# Patient Record
Sex: Male | Born: 1940 | Race: Black or African American | Hispanic: No | Marital: Single | Smoking: Former smoker
Health system: Southern US, Community
[De-identification: ages and names within clinical notes are randomized; demographics above are authoritative.]

## PROBLEM LIST (undated history)

## (undated) DIAGNOSIS — C61 Malignant neoplasm of prostate: Secondary | ICD-10-CM

## (undated) DIAGNOSIS — C419 Malignant neoplasm of bone and articular cartilage, unspecified: Secondary | ICD-10-CM

## (undated) DIAGNOSIS — E785 Hyperlipidemia, unspecified: Secondary | ICD-10-CM

## (undated) HISTORY — DX: Malignant neoplasm of bone and articular cartilage, unspecified: C41.9

## (undated) HISTORY — DX: Malignant neoplasm of prostate: C61

## (undated) HISTORY — PX: ANKLE SURGERY: SHX546

## (undated) HISTORY — DX: Hyperlipidemia, unspecified: E78.5

---

## 2006-10-11 ENCOUNTER — Emergency Department: Payer: Self-pay

## 2006-11-11 ENCOUNTER — Ambulatory Visit: Payer: Self-pay

## 2014-05-14 DIAGNOSIS — D649 Anemia, unspecified: Secondary | ICD-10-CM | POA: Insufficient documentation

## 2014-06-12 ENCOUNTER — Ambulatory Visit: Payer: Self-pay | Admitting: Family Medicine

## 2014-06-19 ENCOUNTER — Ambulatory Visit: Payer: Self-pay | Admitting: Oncology

## 2014-06-19 LAB — CBC CANCER CENTER
BASOS PCT: 0.5 %
Basophil #: 0 x10 3/mm (ref 0.0–0.1)
EOS PCT: 0.8 %
Eosinophil #: 0.1 x10 3/mm (ref 0.0–0.7)
HCT: 35.6 % — ABNORMAL LOW (ref 40.0–52.0)
HGB: 11.6 g/dL — ABNORMAL LOW (ref 13.0–18.0)
LYMPHS ABS: 2 x10 3/mm (ref 1.0–3.6)
Lymphocyte %: 23 %
MCH: 27.4 pg (ref 26.0–34.0)
MCHC: 32.7 g/dL (ref 32.0–36.0)
MCV: 84 fL (ref 80–100)
Monocyte #: 0.6 x10 3/mm (ref 0.2–1.0)
Monocyte %: 7.5 %
NEUTROS PCT: 68.2 %
Neutrophil #: 5.9 x10 3/mm (ref 1.4–6.5)
Platelet: 344 x10 3/mm (ref 150–440)
RBC: 4.24 10*6/uL — ABNORMAL LOW (ref 4.40–5.90)
RDW: 16 % — AB (ref 11.5–14.5)
WBC: 8.6 x10 3/mm (ref 3.8–10.6)

## 2014-06-19 LAB — COMPREHENSIVE METABOLIC PANEL
Albumin: 2.7 g/dL — ABNORMAL LOW (ref 3.4–5.0)
Alkaline Phosphatase: 595 U/L — ABNORMAL HIGH
Anion Gap: 7 (ref 7–16)
BUN: 17 mg/dL (ref 7–18)
Bilirubin,Total: 0.7 mg/dL (ref 0.2–1.0)
CO2: 31 mmol/L (ref 21–32)
Calcium, Total: 8.9 mg/dL (ref 8.5–10.1)
Chloride: 102 mmol/L (ref 98–107)
Creatinine: 1.67 mg/dL — ABNORMAL HIGH (ref 0.60–1.30)
EGFR (African American): 46 — ABNORMAL LOW
EGFR (Non-African Amer.): 40 — ABNORMAL LOW
Glucose: 105 mg/dL — ABNORMAL HIGH (ref 65–99)
Osmolality: 281 (ref 275–301)
POTASSIUM: 3.5 mmol/L (ref 3.5–5.1)
SGOT(AST): 91 U/L — ABNORMAL HIGH (ref 15–37)
SGPT (ALT): 65 U/L — ABNORMAL HIGH
SODIUM: 140 mmol/L (ref 136–145)
Total Protein: 7.6 g/dL (ref 6.4–8.2)

## 2014-06-19 LAB — PROTIME-INR
INR: 1
PROTHROMBIN TIME: 12.7 s (ref 11.5–14.7)

## 2014-06-19 LAB — APTT: Activated PTT: 35.2 secs (ref 23.6–35.9)

## 2014-06-21 LAB — CEA: CEA: 1 ng/mL (ref 0.0–4.7)

## 2014-06-21 LAB — PSA: PSA: 950.5 ng/mL — AB (ref 0.0–4.0)

## 2014-06-22 ENCOUNTER — Ambulatory Visit: Payer: Self-pay | Admitting: Oncology

## 2014-06-27 LAB — PATHOLOGY REPORT

## 2014-07-10 ENCOUNTER — Ambulatory Visit: Payer: Self-pay | Admitting: Oncology

## 2014-08-09 ENCOUNTER — Ambulatory Visit: Payer: Self-pay | Admitting: Oncology

## 2014-08-21 LAB — COMPREHENSIVE METABOLIC PANEL
ALT: 26 U/L
Albumin: 3.1 g/dL — ABNORMAL LOW (ref 3.4–5.0)
Alkaline Phosphatase: 374 U/L — ABNORMAL HIGH
Anion Gap: 4 — ABNORMAL LOW (ref 7–16)
BILIRUBIN TOTAL: 0.3 mg/dL (ref 0.2–1.0)
BUN: 15 mg/dL (ref 7–18)
CHLORIDE: 101 mmol/L (ref 98–107)
Calcium, Total: 8.9 mg/dL (ref 8.5–10.1)
Co2: 30 mmol/L (ref 21–32)
Creatinine: 1.2 mg/dL (ref 0.60–1.30)
EGFR (Non-African Amer.): >60
Glucose: 98 mg/dL (ref 65–99)
Osmolality: 271 (ref 275–301)
POTASSIUM: 5 mmol/L (ref 3.5–5.1)
SGOT(AST): 25 U/L (ref 15–37)
Sodium: 135 mmol/L — ABNORMAL LOW (ref 136–145)
Total Protein: 7.1 g/dL (ref 6.4–8.2)

## 2014-08-21 LAB — CBC CANCER CENTER
BASOS ABS: 0.1 x10 3/mm (ref 0.0–0.1)
BASOS PCT: 0.9 %
EOS PCT: 1.1 %
Eosinophil #: 0.1 x10 3/mm (ref 0.0–0.7)
HCT: 31.8 % — ABNORMAL LOW (ref 40.0–52.0)
HGB: 10.2 g/dL — AB (ref 13.0–18.0)
LYMPHS ABS: 2.7 x10 3/mm (ref 1.0–3.6)
Lymphocyte %: 37.4 %
MCH: 27.5 pg (ref 26.0–34.0)
MCHC: 32 g/dL (ref 32.0–36.0)
MCV: 86 fL (ref 80–100)
MONO ABS: 0.8 x10 3/mm (ref 0.2–1.0)
Monocyte %: 10.8 %
Neutrophil #: 3.6 x10 3/mm (ref 1.4–6.5)
Neutrophil %: 49.8 %
PLATELETS: 299 x10 3/mm (ref 150–440)
RBC: 3.7 10*6/uL — ABNORMAL LOW (ref 4.40–5.90)
RDW: 18.2 % — ABNORMAL HIGH (ref 11.5–14.5)
WBC: 7.2 x10 3/mm (ref 3.8–10.6)

## 2014-08-23 LAB — PSA: PSA: 79.5 ng/mL — ABNORMAL HIGH (ref 0.0–4.0)

## 2014-09-09 ENCOUNTER — Ambulatory Visit: Payer: Self-pay | Admitting: Oncology

## 2014-09-18 LAB — CBC CANCER CENTER
Basophil #: 0 x10 3/mm (ref 0.0–0.1)
Basophil %: 0.6 %
EOS ABS: 0 x10 3/mm (ref 0.0–0.7)
EOS PCT: 0.6 %
HCT: 32.5 % — AB (ref 40.0–52.0)
HGB: 10.5 g/dL — ABNORMAL LOW (ref 13.0–18.0)
LYMPHS PCT: 32.6 %
Lymphocyte #: 2.5 x10 3/mm (ref 1.0–3.6)
MCH: 27.9 pg (ref 26.0–34.0)
MCHC: 32.1 g/dL (ref 32.0–36.0)
MCV: 87 fL (ref 80–100)
Monocyte #: 0.7 x10 3/mm (ref 0.2–1.0)
Monocyte %: 9.2 %
Neutrophil #: 4.3 x10 3/mm (ref 1.4–6.5)
Neutrophil %: 57 %
Platelet: 319 x10 3/mm (ref 150–440)
RBC: 3.75 10*6/uL — ABNORMAL LOW (ref 4.40–5.90)
RDW: 16.9 % — AB (ref 11.5–14.5)
WBC: 7.6 x10 3/mm (ref 3.8–10.6)

## 2014-09-18 LAB — COMPREHENSIVE METABOLIC PANEL
Albumin: 3.4 g/dL (ref 3.4–5.0)
Alkaline Phosphatase: 232 U/L — ABNORMAL HIGH
Anion Gap: 5 — ABNORMAL LOW (ref 7–16)
BILIRUBIN TOTAL: 0.3 mg/dL (ref 0.2–1.0)
BUN: 20 mg/dL — ABNORMAL HIGH (ref 7–18)
CALCIUM: 8.1 mg/dL — AB (ref 8.5–10.1)
CO2: 27 mmol/L (ref 21–32)
Chloride: 105 mmol/L (ref 98–107)
Creatinine: 1.17 mg/dL (ref 0.60–1.30)
EGFR (African American): >60
EGFR (Non-African Amer.): >60
GLUCOSE: 131 mg/dL — AB (ref 65–99)
OSMOLALITY: 278 (ref 275–301)
Potassium: 5.1 mmol/L (ref 3.5–5.1)
SGOT(AST): 21 U/L (ref 15–37)
SGPT (ALT): 23 U/L
Sodium: 137 mmol/L (ref 136–145)
TOTAL PROTEIN: 7.3 g/dL (ref 6.4–8.2)

## 2014-09-19 LAB — PSA: PSA: 78.4 ng/mL — ABNORMAL HIGH (ref 0.0–4.0)

## 2014-10-09 ENCOUNTER — Ambulatory Visit: Payer: Self-pay | Admitting: Oncology

## 2014-10-16 LAB — COMPREHENSIVE METABOLIC PANEL
ALK PHOS: 190 U/L — AB
Albumin: 3.6 g/dL (ref 3.4–5.0)
Anion Gap: 6 — ABNORMAL LOW (ref 7–16)
BUN: 13 mg/dL (ref 7–18)
Bilirubin,Total: 0.3 mg/dL (ref 0.2–1.0)
CALCIUM: 7.7 mg/dL — AB (ref 8.5–10.1)
CHLORIDE: 102 mmol/L (ref 98–107)
Co2: 27 mmol/L (ref 21–32)
Creatinine: 1.21 mg/dL (ref 0.60–1.30)
EGFR (African American): >60
EGFR (Non-African Amer.): >60
GLUCOSE: 99 mg/dL (ref 65–99)
OSMOLALITY: 270 (ref 275–301)
Potassium: 5.8 mmol/L — ABNORMAL HIGH (ref 3.5–5.1)
SGOT(AST): 24 U/L (ref 15–37)
SGPT (ALT): 36 U/L
Sodium: 135 mmol/L — ABNORMAL LOW (ref 136–145)
TOTAL PROTEIN: 7.8 g/dL (ref 6.4–8.2)

## 2014-10-16 LAB — CBC CANCER CENTER
BASOS ABS: 0 x10 3/mm (ref 0.0–0.1)
BASOS PCT: 0.4 %
Eosinophil #: 0.1 x10 3/mm (ref 0.0–0.7)
Eosinophil %: 0.7 %
HCT: 38 % — ABNORMAL LOW (ref 40.0–52.0)
HGB: 12.1 g/dL — ABNORMAL LOW (ref 13.0–18.0)
Lymphocyte #: 2.8 x10 3/mm (ref 1.0–3.6)
Lymphocyte %: 26.9 %
MCH: 27.3 pg (ref 26.0–34.0)
MCHC: 31.9 g/dL — ABNORMAL LOW (ref 32.0–36.0)
MCV: 86 fL (ref 80–100)
MONOS PCT: 8.5 %
Monocyte #: 0.9 x10 3/mm (ref 0.2–1.0)
NEUTROS ABS: 6.7 x10 3/mm — AB (ref 1.4–6.5)
NEUTROS PCT: 63.5 %
PLATELETS: 326 x10 3/mm (ref 150–440)
RBC: 4.44 10*6/uL (ref 4.40–5.90)
RDW: 15.6 % — ABNORMAL HIGH (ref 11.5–14.5)
WBC: 10.5 x10 3/mm (ref 3.8–10.6)

## 2014-10-17 LAB — PSA: PSA: 96.5 ng/mL — ABNORMAL HIGH (ref 0.0–4.0)

## 2014-11-09 ENCOUNTER — Ambulatory Visit: Payer: Self-pay | Admitting: Oncology

## 2014-11-20 LAB — COMPREHENSIVE METABOLIC PANEL
Albumin: 3.3 g/dL — ABNORMAL LOW (ref 3.4–5.0)
Alkaline Phosphatase: 148 U/L — ABNORMAL HIGH
Anion Gap: 4 — ABNORMAL LOW (ref 7–16)
BILIRUBIN TOTAL: 0.3 mg/dL (ref 0.2–1.0)
BUN: 9 mg/dL (ref 7–18)
Calcium, Total: 7.5 mg/dL — ABNORMAL LOW (ref 8.5–10.1)
Chloride: 108 mmol/L — ABNORMAL HIGH (ref 98–107)
Co2: 27 mmol/L (ref 21–32)
Creatinine: 0.96 mg/dL (ref 0.60–1.30)
EGFR (Non-African Amer.): >60
GLUCOSE: 71 mg/dL (ref 65–99)
Osmolality: 275 (ref 275–301)
POTASSIUM: 4.9 mmol/L (ref 3.5–5.1)
SGOT(AST): 26 U/L (ref 15–37)
SGPT (ALT): 44 U/L
Sodium: 139 mmol/L (ref 136–145)
TOTAL PROTEIN: 7 g/dL (ref 6.4–8.2)

## 2014-11-20 LAB — CBC CANCER CENTER
Basophil #: 0.1 x10 3/mm (ref 0.0–0.1)
Basophil %: 0.9 %
EOS PCT: 1.7 %
Eosinophil #: 0.1 x10 3/mm (ref 0.0–0.7)
HCT: 35.4 % — AB (ref 40.0–52.0)
HGB: 11.4 g/dL — ABNORMAL LOW (ref 13.0–18.0)
LYMPHS ABS: 2.3 x10 3/mm (ref 1.0–3.6)
Lymphocyte %: 36.7 %
MCH: 27.1 pg (ref 26.0–34.0)
MCHC: 32.2 g/dL (ref 32.0–36.0)
MCV: 84 fL (ref 80–100)
MONOS PCT: 12.1 %
Monocyte #: 0.8 x10 3/mm (ref 0.2–1.0)
Neutrophil #: 3 x10 3/mm (ref 1.4–6.5)
Neutrophil %: 48.6 %
PLATELETS: 261 x10 3/mm (ref 150–440)
RBC: 4.21 10*6/uL — ABNORMAL LOW (ref 4.40–5.90)
RDW: 16 % — ABNORMAL HIGH (ref 11.5–14.5)
WBC: 6.3 x10 3/mm (ref 3.8–10.6)

## 2014-11-21 LAB — PSA: PSA: 87.6 ng/mL — ABNORMAL HIGH (ref 0.0–4.0)

## 2014-12-10 ENCOUNTER — Ambulatory Visit: Payer: Self-pay | Admitting: Oncology

## 2014-12-12 ENCOUNTER — Ambulatory Visit: Payer: Self-pay | Admitting: Ophthalmology

## 2015-01-02 ENCOUNTER — Ambulatory Visit: Payer: Self-pay | Admitting: Ophthalmology

## 2015-01-08 ENCOUNTER — Ambulatory Visit
Admit: 2015-01-08 | Disposition: A | Discharge status: Home / Self Care | Payer: Self-pay | Attending: Oncology | Admitting: Oncology

## 2015-02-08 ENCOUNTER — Ambulatory Visit
Admit: 2015-02-08 | Disposition: A | Discharge status: Home / Self Care | Payer: Self-pay | Attending: Oncology | Admitting: Oncology

## 2015-02-11 LAB — CREATININE, SERUM: Creatine, Serum: 1.01

## 2015-02-14 LAB — COMPREHENSIVE METABOLIC PANEL
ALK PHOS: 161 U/L — AB
Albumin: 3.9 g/dL
Anion Gap: 7 (ref 7–16)
BILIRUBIN TOTAL: 0.6 mg/dL
BUN: 15 mg/dL
CO2: 26 mmol/L
CREATININE: 1.04 mg/dL
Calcium, Total: 8.6 mg/dL — ABNORMAL LOW
Chloride: 103 mmol/L
EGFR (African American): >60
EGFR (Non-African Amer.): >60
Glucose: 113 mg/dL — ABNORMAL HIGH
Potassium: 5 mmol/L
SGOT(AST): 28 U/L
SGPT (ALT): 18 U/L
Sodium: 136 mmol/L
Total Protein: 8.2 g/dL — ABNORMAL HIGH

## 2015-02-14 LAB — CBC CANCER CENTER
BASOS ABS: 0.1 x10 3/mm (ref 0.0–0.1)
Basophil %: 0.7 %
EOS ABS: 0.1 x10 3/mm (ref 0.0–0.7)
Eosinophil %: 0.8 %
HCT: 37.1 % — AB (ref 40.0–52.0)
HGB: 12.1 g/dL — ABNORMAL LOW (ref 13.0–18.0)
LYMPHS PCT: 30.1 %
Lymphocyte #: 2.4 x10 3/mm (ref 1.0–3.6)
MCH: 27 pg (ref 26.0–34.0)
MCHC: 32.5 g/dL (ref 32.0–36.0)
MCV: 83 fL (ref 80–100)
MONOS PCT: 8.8 %
Monocyte #: 0.7 x10 3/mm (ref 0.2–1.0)
NEUTROS ABS: 4.8 x10 3/mm (ref 1.4–6.5)
NEUTROS PCT: 59.6 %
PLATELETS: 320 x10 3/mm (ref 150–440)
RBC: 4.46 10*6/uL (ref 4.40–5.90)
RDW: 15.1 % — ABNORMAL HIGH (ref 11.5–14.5)
WBC: 8.1 x10 3/mm (ref 3.8–10.6)

## 2015-02-15 LAB — PSA: PSA: 229 ng/mL — ABNORMAL HIGH (ref 0.0–4.0)

## 2015-02-22 DIAGNOSIS — C61 Malignant neoplasm of prostate: Secondary | ICD-10-CM

## 2015-02-22 LAB — COMPREHENSIVE METABOLIC PANEL
ALT: 63 U/L
ANION GAP: 7 (ref 7–16)
Albumin: 3.3 g/dL — ABNORMAL LOW
Alkaline Phosphatase: 256 U/L — ABNORMAL HIGH
BILIRUBIN TOTAL: 1 mg/dL
BUN: 25 mg/dL — AB
CREATININE: 0.96 mg/dL
Calcium, Total: 7.7 mg/dL — ABNORMAL LOW
Chloride: 96 mmol/L — ABNORMAL LOW
Co2: 26 mmol/L
EGFR (African American): >60
EGFR (Non-African Amer.): >60
Glucose: 111 mg/dL — ABNORMAL HIGH
Potassium: 4.4 mmol/L
SGOT(AST): 65 U/L — ABNORMAL HIGH
SODIUM: 129 mmol/L — AB
TOTAL PROTEIN: 8 g/dL

## 2015-02-22 LAB — CBC CANCER CENTER
BASOS ABS: 0 x10 3/mm (ref 0.0–0.1)
Basophil %: 0.4 %
Eosinophil #: 0.1 x10 3/mm (ref 0.0–0.7)
Eosinophil %: 0.6 %
HCT: 33.5 % — AB (ref 40.0–52.0)
HGB: 10.9 g/dL — AB (ref 13.0–18.0)
LYMPHS PCT: 23 %
Lymphocyte #: 1.9 x10 3/mm (ref 1.0–3.6)
MCH: 27.1 pg (ref 26.0–34.0)
MCHC: 32.6 g/dL (ref 32.0–36.0)
MCV: 83 fL (ref 80–100)
MONOS PCT: 11.4 %
Monocyte #: 0.9 x10 3/mm (ref 0.2–1.0)
Neutrophil #: 5.2 x10 3/mm (ref 1.4–6.5)
Neutrophil %: 64.6 %
Platelet: 379 x10 3/mm (ref 150–440)
RBC: 4.03 10*6/uL — ABNORMAL LOW (ref 4.40–5.90)
RDW: 14.7 % — AB (ref 11.5–14.5)
WBC: 8.1 x10 3/mm (ref 3.8–10.6)

## 2015-02-22 LAB — LACTATE DEHYDROGENASE: LDH: 476 U/L — ABNORMAL HIGH

## 2015-03-11 ENCOUNTER — Encounter: Payer: Self-pay | Admitting: *Deleted

## 2015-03-11 ENCOUNTER — Other Ambulatory Visit: Payer: Self-pay | Admitting: Oncology

## 2015-03-11 ENCOUNTER — Other Ambulatory Visit: Payer: Self-pay | Admitting: *Deleted

## 2015-03-11 DIAGNOSIS — C61 Malignant neoplasm of prostate: Secondary | ICD-10-CM

## 2015-03-13 ENCOUNTER — Encounter: Payer: Self-pay | Admitting: Oncology

## 2015-03-13 ENCOUNTER — Inpatient Hospital Stay: Payer: Medicare Other | Attending: Oncology | Admitting: Oncology

## 2015-03-13 ENCOUNTER — Inpatient Hospital Stay: Payer: Medicare Other

## 2015-03-13 ENCOUNTER — Other Ambulatory Visit: Payer: Self-pay

## 2015-03-13 ENCOUNTER — Encounter: Payer: Self-pay | Admitting: *Deleted

## 2015-03-13 VITALS — BP 144/70 | HR 72 | Temp 97.9°F | Ht 70.0 in | Wt 144.8 lb

## 2015-03-13 DIAGNOSIS — C787 Secondary malignant neoplasm of liver and intrahepatic bile duct: Secondary | ICD-10-CM | POA: Diagnosis not present

## 2015-03-13 DIAGNOSIS — C61 Malignant neoplasm of prostate: Secondary | ICD-10-CM | POA: Diagnosis present

## 2015-03-13 DIAGNOSIS — R972 Elevated prostate specific antigen [PSA]: Secondary | ICD-10-CM | POA: Diagnosis not present

## 2015-03-13 DIAGNOSIS — C7951 Secondary malignant neoplasm of bone: Secondary | ICD-10-CM | POA: Diagnosis not present

## 2015-03-13 DIAGNOSIS — Z79899 Other long term (current) drug therapy: Secondary | ICD-10-CM | POA: Diagnosis not present

## 2015-03-13 DIAGNOSIS — Z87891 Personal history of nicotine dependence: Secondary | ICD-10-CM | POA: Insufficient documentation

## 2015-03-13 LAB — COMPREHENSIVE METABOLIC PANEL
ALT: 19 U/L (ref 17–63)
ANION GAP: 8 (ref 5–15)
AST: 30 U/L (ref 15–41)
Albumin: 3.4 g/dL — ABNORMAL LOW (ref 3.5–5.0)
Alkaline Phosphatase: 178 U/L — ABNORMAL HIGH (ref 38–126)
BUN: 28 mg/dL — ABNORMAL HIGH (ref 6–20)
CALCIUM: 8.1 mg/dL — AB (ref 8.9–10.3)
CHLORIDE: 101 mmol/L (ref 101–111)
CO2: 24 mmol/L (ref 22–32)
Creatinine, Ser: 1 mg/dL (ref 0.61–1.24)
GFR calc non Af Amer: >60 mL/min (ref >60)
GLUCOSE: 145 mg/dL — AB (ref 65–99)
Potassium: 4.9 mmol/L (ref 3.5–5.1)
SODIUM: 133 mmol/L — AB (ref 135–145)
Total Bilirubin: 0.4 mg/dL (ref 0.3–1.2)
Total Protein: 7.7 g/dL (ref 6.5–8.1)

## 2015-03-13 LAB — CBC WITH DIFFERENTIAL/PLATELET
Basophils Absolute: 0 10*3/uL (ref 0–0.1)
Basophils Relative: 1 %
Eosinophils Absolute: 0.1 10*3/uL (ref 0–0.7)
Eosinophils Relative: 1 %
HEMATOCRIT: 31.2 % — AB (ref 40.0–52.0)
HEMOGLOBIN: 10.2 g/dL — AB (ref 13.0–18.0)
Lymphs Abs: 2.3 10*3/uL (ref 1.0–3.6)
MCH: 27.4 pg (ref 26.0–34.0)
MCHC: 32.7 g/dL (ref 32.0–36.0)
MCV: 83.8 fL (ref 80.0–100.0)
Monocytes Absolute: 0.7 10*3/uL (ref 0.2–1.0)
Monocytes Relative: 10 %
NEUTROS ABS: 4 10*3/uL (ref 1.4–6.5)
Neutrophils Relative %: 55 %
Platelets: 291 10*3/uL (ref 150–440)
RBC: 3.72 MIL/uL — AB (ref 4.40–5.90)
RDW: 14.9 % — ABNORMAL HIGH (ref 11.5–14.5)
WBC: 7.1 10*3/uL (ref 3.8–10.6)

## 2015-03-13 MED ORDER — DEGARELIX ACETATE 80 MG ~~LOC~~ SOLR
80.0000 mg | Freq: Once | SUBCUTANEOUS | Status: AC
  Administered 2015-03-13: 80 mg via SUBCUTANEOUS
  Filled 2015-03-13: qty 4

## 2015-03-13 MED ORDER — OXYCODONE HCL 10 MG PO TABS: 10.0000 mg | ORAL_TABLET | Freq: Four times a day (QID) | ORAL | Status: DC | PRN

## 2015-03-13 MED ORDER — ABIRATERONE ACETATE 250 MG PO TABS: 1000.0000 mg | ORAL_TABLET | Freq: Every day | ORAL | Status: DC

## 2015-03-13 MED ORDER — PREDNISONE 5 MG PO TABS: 5.0000 mg | ORAL_TABLET | Freq: Two times a day (BID) | ORAL | Status: DC

## 2015-03-13 MED ORDER — DENOSUMAB 120 MG/1.7ML ~~LOC~~ SOLN
120.0000 mg | Freq: Once | SUBCUTANEOUS | Status: AC
  Administered 2015-03-13: 120 mg via SUBCUTANEOUS
  Filled 2015-03-13: qty 1.7

## 2015-03-13 MED ORDER — FENTANYL 50 MCG/HR TD PT72: 50.0000 ug | MEDICATED_PATCH | TRANSDERMAL | Status: DC

## 2015-03-13 MED ORDER — INV-ENZALUTAMIDE 40 MG CAPS #120 ALLIANCE A031201: 160.0000 mg | ORAL_CAPSULE | Freq: Every day | ORAL | Status: DC

## 2015-03-13 NOTE — Progress Notes (Signed)
Research Progress Note, [facility] Protocol [id number]-Cycle 1/ Day 1: Patient and his caregiver arrived in clinic today to initiate treatment with Enzalutamide, Abiraterone, and Prednisone.  The patient had a CBC, Met C,PSA, and central labs drawn.  The CBC and Met C results were cleared with Dr. Oliva Bustard and the patient was approved to start his protocol treatment. PSA is pending. Adverse events of  hyponatremia, hypocalcemia, hypoalbuminemia, elevated CO2, elevated alkaline phosphatase,  hyperglycemia ( non-fasting)and elevated BUN are all grade 1 and non-reportable as they are not related to study. Performance status = 1.  I met with the patient and his caregiver to review the medications, the drug diary, and provided them with medicine cups, gloves, Enzalutamide (120 capsules), Abiraterone (120 capsules), and instructions sheets for all drugs.  Of note patient is obtaining prednisone (#180, 5 mg tablets) from his local pharmacy.  He took his dose of Enzalutamide (160 mg) and Prednisone (5 mg) at 2:00 pm in the clinic under my supervision.   He will take his evening dose of Prednisone around 10 pm this evening. He will start Abiraterone (1029m) this evening as well.  His doses were logged on his diary.  I reviewed the PK questionnaire and have noted on my calender to call and remind them that this needs to be completed on Day 26, 27, and 28.  I reviewed side effects of the medications as outlined in the consent form. I instructed him to call me if he had questions, concerns, or started experiencing any side effects. He will return to clinic in 2 weeks for LFT's and in 1 month for Cycle 2/Day 1. A copy of my business card was included in the front of his medication binder and he and his caregiver know how to reach me or Dr. CMetro Kungoffice with questions/concerns.

## 2015-03-14 ENCOUNTER — Ambulatory Visit: Payer: Self-pay | Admitting: Oncology

## 2015-03-14 ENCOUNTER — Other Ambulatory Visit: Payer: Self-pay

## 2015-03-14 ENCOUNTER — Other Ambulatory Visit: Payer: Self-pay | Admitting: *Deleted

## 2015-03-14 ENCOUNTER — Ambulatory Visit: Payer: Self-pay

## 2015-03-14 DIAGNOSIS — C61 Malignant neoplasm of prostate: Secondary | ICD-10-CM

## 2015-03-14 LAB — PSA: PSA: 311 ng/mL — ABNORMAL HIGH (ref 0.00–4.00)

## 2015-03-17 NOTE — Progress Notes (Signed)
Cancer Center Progress Note  [Authored: 13-Apr-16 21:43]- for Visit: [id number], Complete, Revised, Signed in Full, General  HPI: Referred by [facility]([id number])  This 74 year old Male patient presents to the clinic for follow up (1)for metastatic prostate cancer(1)   [place] @ [facility] Telephone:(336) [phone]  Fax:(336) [place]: [birth year]  MR#: [id number]  PQZ#:[id number]  Patient Care Team: No Pcp Per Patient as PCP - General (General Practice)  CHIEF COMPLAINT:  Chief Complaint  Patient presents with  . Follow-up  . Colon Cancer    Oncology History   1.MRI scan on August 4 of 2015  revealed in your able liver lesion and multiple areas  of   abnormallity  in the bone suggestive off metastases  2.High PSA. 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  Patient has been started on [facility] (August, 2015) and Delton See, 4.patient started on [facility] protocol with Gillermina Phy nd ZYTIGA (May, 2016)     Prostate cancer metastatic to multiple sites   03/11/2015 Initial Diagnosis Prostate cancer metastatic to multiple sites    Oncology Flowsheet 03/13/2015  degarelix (FIRMAGON) [place] 80 mg  denosumab (XGEVA) [place] 120 mg    INTERVAL HISTORY: HPI:   74 year old-year-old gentleman came today further followup.  Liver biopsy is consistent with prostate cancer.  patient tolerated  FIRMAGON oading does very well.  Significant improvement in bony pains.     No swelling of lower extremity.  No chills.  No fever .Patient is here for ongoing evaluation regarding carcinoma prostate  stage IV disease, No pain.  Appetite has been stable.  PSA is rising. No nausea vomiting diarrhea.pain is well controlled with fentanyl patch and breakthrough pain medication. Patient is here to discuss options for further treatment.  Patient is rising PSA. Patient has been randomized to getXTANDI and  ZYTIGA under [facility] protocol.  Will continue androgen deprivation therapy as well as Xgeva.  Informed consent has been obtained.  Patient and family a number of questions.  Discussion with the research nurse.  Pain is under better control  REVIEW OF SYSTEMS:   Review of Systems  Constitutional: Negative for fever, chills, weight loss, malaise/fatigue and diaphoresis.  HENT: Negative for congestion, ear discharge, ear pain, hearing loss, nosebleeds, sore throat and tinnitus.   Eyes: Negative for blurred vision, double vision, photophobia, pain, discharge and redness.  Respiratory: Negative for cough, hemoptysis, sputum production, shortness of breath, wheezing and stridor.   Cardiovascular: Negative for chest pain, palpitations, orthopnea, claudication, leg swelling and PND.  Gastrointestinal: Negative for heartburn, nausea, vomiting, abdominal pain, diarrhea, constipation, blood in stool and melena.  Genitourinary: Negative for dysuria, urgency, frequency, hematuria and flank pain.  Musculoskeletal: Positive for myalgias, back pain and joint pain. Negative for falls and neck pain.  Skin: Negative for itching and rash.  Neurological: Negative for dizziness, tingling, tremors, sensory change, speech change, focal weakness, seizures, loss of consciousness, weakness and headaches.  Endo/Heme/Allergies: Negative for environmental allergies and polydipsia. Does not bruise/bleed easily.  Psychiatric/Behavioral: Negative for depression, suicidal ideas, hallucinations, memory loss and substance abuse. The patient is not nervous/anxious and does not have insomnia.   All other systems reviewed and are negative.   As per HPI. Otherwise, a complete review of systems is negatve.  PAST MEDICAL HISTORY: Past Medical History  Diagnosis Date  . Prostate cancer   . Bone cancer   . Hyperlipidemia  PAST SURGICAL HISTORY: Past Surgical History  Procedure Laterality Date  . Ankle surgery Right      FAMILY HISTORY History reviewed. No pertinent family history.     HEALTH MAINTENANCE: History  Substance Use Topics  . Smoking status: Former Smoker -- 0.50 packs/day for 45 years    Types: Cigarettes    Quit date: 11/13/1991  . Smokeless tobacco: Not on file  . Alcohol Use: 29.4 oz/week    6 Cans of beer, 43 Shots of liquor per week     Colonoscopy:  PAP:  Bone density:  Lipid panel:  Allergies  Allergen Reactions  . No Known Allergies      OBJECTIVE: Filed Vitals:   03/13/15 1230  BP: 144/70  Pulse: 72  Temp: 97.9 F (36.6 C)     Body mass index is 20.78 kg/(m^2).    ECOG FS:1 - Symptomatic but completely ambulatory  Physical Exam  Constitutional: No distress.  HENT:  Head: Normocephalic and atraumatic.  Right Ear: External ear normal.  Left Ear: External ear normal.  Nose: Nose normal.  Mouth/Throat: Oropharynx is clear and moist.  Eyes: Conjunctivae and EOM are normal. Pupils are equal, round, and reactive to light.  Neck: Normal range of motion. Neck supple. No JVD present. No tracheal deviation present. No thyromegaly present.  Cardiovascular: Normal rate, normal heart sounds and intact distal pulses.  Exam reveals no gallop and no friction rub.   No murmur heard. Pulmonary/Chest: No stridor. No respiratory distress. He has no wheezes. He has no rales. He exhibits no tenderness.  Abdominal: Soft. Bowel sounds are normal. He exhibits mass (LIVER IS PALPABLE 2 FINGER BELOW COSTAL MARGIN). He exhibits no distension. There is no tenderness. There is no rebound and no guarding.  Musculoskeletal: He exhibits no edema or tenderness.  Neurological: He displays normal reflexes. No cranial nerve deficit. He exhibits normal muscle tone. Coordination normal.  Skin: No rash noted. He is not diaphoretic. No erythema.  Psychiatric: Memory, affect and judgment normal.  Nursing note and vitals reviewed.    LAB RESULTS:     Component Value Date/Time   NA 133*  03/13/2015 1144   NA 129* 02/22/2015 0932   K 4.9 03/13/2015 1144   K 4.4 02/22/2015 0932   CL 101 03/13/2015 1144   CL 96* 02/22/2015 0932   CO2 24 03/13/2015 1144   CO2 26 02/22/2015 0932   GLUCOSE 145* 03/13/2015 1144   GLUCOSE 111* 02/22/2015 0932   BUN 28* 03/13/2015 1144   BUN 25* 02/22/2015 0932   CREATININE 1.00 03/13/2015 1144   CREATININE 0.96 02/22/2015 0932   CALCIUM 8.1* 03/13/2015 1144   CALCIUM 7.7* 02/22/2015 0932   PROT 7.7 03/13/2015 1144   PROT 8.0 02/22/2015 0932   ALBUMIN 3.4* 03/13/2015 1144   ALBUMIN 3.3* 02/22/2015 0932   AST 30 03/13/2015 1144   AST 65* 02/22/2015 0932   ALT 19 03/13/2015 1144   ALT 63 02/22/2015 0932   ALKPHOS 178* 03/13/2015 1144   ALKPHOS 256* 02/22/2015 0932   BILITOT 0.4 03/13/2015 1144   GFRNONAA >60 03/13/2015 1144   GFRNONAA >60 02/22/2015 0932   GFRAA >60 03/13/2015 1144   GFRAA >60 02/22/2015 0932    No results found for: SPEP, UPEP  Lab Results  Component Value Date   WBC 7.1 03/13/2015   NEUTROABS 4.0 03/13/2015   HGB 10.2* 03/13/2015   HCT 31.2* 03/13/2015   MCV 83.8 03/13/2015   PLT 291 03/13/2015  Chemistry      Component Value Date/Time   NA 133* 03/13/2015 1144   NA 129* 02/22/2015 0932   K 4.9 03/13/2015 1144   K 4.4 02/22/2015 0932   CL 101 03/13/2015 1144   CL 96* 02/22/2015 0932   CO2 24 03/13/2015 1144   CO2 26 02/22/2015 0932   BUN 28* 03/13/2015 1144   BUN 25* 02/22/2015 0932   CREATININE 1.00 03/13/2015 1144   CREATININE 0.96 02/22/2015 0932      Component Value Date/Time   CALCIUM 8.1* 03/13/2015 1144   CALCIUM 7.7* 02/22/2015 0932   ALKPHOS 178* 03/13/2015 1144   ALKPHOS 256* 02/22/2015 0932   AST 30 03/13/2015 1144   AST 65* 02/22/2015 0932   ALT 19 03/13/2015 1144   ALT 63 02/22/2015 0932   BILITOT 0.4 03/13/2015 1144       No results found for: LABCA2  No components found for: LABCA125  No results for input(s): INR in the last 168 hours.  No results found for:  COLORURINE, APPEARANCEUR, LABSPEC, PHURINE, GLUCOSEU, HGBUR, BILIRUBINUR, KETONESUR, PROTEINUR, UROBILINOGEN, NITRITE, LEUKOCYTESUR  STUDIES: Nm Bone Scan Whole Body  03/08/2015   ADDENDUM REPORT: 03/08/2015 14:49  ADDENDUM: PCWG2 Measurements for Bone Metastasis:  Baseline exam  1. Four metastatic lesions within the proximal right femur. 2. Multiple lesions within the left right sacral ala. 3. Multiple lesions in the lumbar spine and thoracic thoracic spine. 4. Mottled activity within the ribs consistent diffuse metastasis. 5. Lesions within left and right scapula.   Electronically Signed   By: Suzy Bouchard M.D.   On: 03/08/2015 14:49   03/08/2015   CLINICAL DATA:  Prostate cancer.  Metastatic disease.  EXAM: NUCLEAR MEDICINE WHOLE BODY BONE SCAN  TECHNIQUE: Whole body anterior and posterior images were obtained approximately 3 hours after intravenous injection of radiopharmaceutical.  RADIOPHARMACEUTICALS:  23.9 mCi MCi Technetium-99 MDP  COMPARISON:  None.  FINDINGS: Bilateral renal function and excretion. Multifocal areas of increased activity noted over the skull, spine, bilateral ribs, pelvis, right proximal femur. These findings are most consistent with metastatic disease. Activity is also noted about the shoulders bilaterally, these changes could be degenerative.  IMPRESSION: Multifocal areas of increased activity noted over the skull, spine, ribs, pelvis, proximal right femur most consistent metastatic disease.  Electronically Signed: ByMarcello Moores  Register On: 02/01/2015 13:59    ASSESSMENT:  Stage IV carcinoma prostate.  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  At this point in time patient has consented for over alliance study and has been randomized toXTANDI and ZYTIGA.  Patient is starting treatment today all the side effects have been reviewed.  Informed consent has been ordered pain.  Patient had a repeat MRI scan and CT scan which is been reviewed  independently.  Also discussion with the research nurse Total duration of visit was 40 minutes and 50% of time was spent in reviewing research trial.   Patient expressed understanding and was in agreement with this plan. He also understands that He can call clinic at any time with any questions, concerns, or complaints.    Prostate cancer metastatic to multiple sites   Staging form: Prostate, AJCC 7th Edition     Clinical: Stage IV (T3, N0, M1b, PSA: 20 or greater, Gleason 8-10 - Poorly differentiated/undifferentiated (marked anaplasia)) - Signed by Forest Gleason, MD on 03/17/2015   Forest Gleason, MD   03/17/2015 12:17 AM          Smoking History:  Smoking History 1(1)Packs per day and Quit 1993 after 30 yr history.(1)                         )     Advance Directive:  Advance Directive ([place]) yes(1)   Do you want to revise or change your advance directive? No(1)

## 2015-03-20 ENCOUNTER — Ambulatory Visit: Payer: Self-pay

## 2015-03-20 ENCOUNTER — Ambulatory Visit: Payer: Self-pay | Admitting: Oncology

## 2015-03-20 ENCOUNTER — Other Ambulatory Visit: Payer: Self-pay

## 2015-03-27 ENCOUNTER — Inpatient Hospital Stay: Payer: Medicare Other

## 2015-03-27 DIAGNOSIS — C61 Malignant neoplasm of prostate: Secondary | ICD-10-CM

## 2015-03-27 LAB — HEPATIC FUNCTION PANEL
ALT: 13 U/L — AB (ref 17–63)
AST: 17 U/L (ref 15–41)
Albumin: 3.5 g/dL (ref 3.5–5.0)
Alkaline Phosphatase: 150 U/L — ABNORMAL HIGH (ref 38–126)
Bilirubin, Direct: <0.1 mg/dL — ABNORMAL LOW (ref 0.1–0.5)
TOTAL PROTEIN: 7.4 g/dL (ref 6.5–8.1)
Total Bilirubin: 0.5 mg/dL (ref 0.3–1.2)

## 2015-04-01 ENCOUNTER — Other Ambulatory Visit: Payer: Self-pay | Admitting: *Deleted

## 2015-04-01 DIAGNOSIS — C61 Malignant neoplasm of prostate: Secondary | ICD-10-CM

## 2015-04-02 ENCOUNTER — Other Ambulatory Visit: Payer: Self-pay | Admitting: *Deleted

## 2015-04-10 ENCOUNTER — Ambulatory Visit: Payer: Medicare Other

## 2015-04-10 ENCOUNTER — Ambulatory Visit: Payer: Medicare Other | Admitting: Oncology

## 2015-04-10 ENCOUNTER — Other Ambulatory Visit: Payer: Medicare Other

## 2015-04-10 ENCOUNTER — Other Ambulatory Visit: Payer: Self-pay | Admitting: *Deleted

## 2015-04-10 DIAGNOSIS — C61 Malignant neoplasm of prostate: Secondary | ICD-10-CM

## 2015-04-11 ENCOUNTER — Ambulatory Visit: Payer: Medicare Other

## 2015-04-11 ENCOUNTER — Inpatient Hospital Stay (HOSPITAL_BASED_OUTPATIENT_CLINIC_OR_DEPARTMENT_OTHER): Payer: Medicare Other | Admitting: Oncology

## 2015-04-11 ENCOUNTER — Encounter: Payer: Self-pay | Admitting: *Deleted

## 2015-04-11 ENCOUNTER — Inpatient Hospital Stay: Payer: Medicare Other

## 2015-04-11 ENCOUNTER — Ambulatory Visit: Payer: Medicare Other | Admitting: Oncology

## 2015-04-11 ENCOUNTER — Other Ambulatory Visit: Payer: Medicare Other

## 2015-04-11 ENCOUNTER — Inpatient Hospital Stay: Payer: Medicare Other | Attending: Oncology

## 2015-04-11 ENCOUNTER — Other Ambulatory Visit: Payer: Self-pay | Admitting: *Deleted

## 2015-04-11 VITALS — BP 144/78 | HR 52 | Temp 95.8°F | Wt 151.2 lb

## 2015-04-11 DIAGNOSIS — C7951 Secondary malignant neoplasm of bone: Secondary | ICD-10-CM | POA: Diagnosis not present

## 2015-04-11 DIAGNOSIS — Z79818 Long term (current) use of other agents affecting estrogen receptors and estrogen levels: Secondary | ICD-10-CM | POA: Diagnosis not present

## 2015-04-11 DIAGNOSIS — C61 Malignant neoplasm of prostate: Secondary | ICD-10-CM

## 2015-04-11 DIAGNOSIS — Z87891 Personal history of nicotine dependence: Secondary | ICD-10-CM | POA: Diagnosis not present

## 2015-04-11 DIAGNOSIS — C787 Secondary malignant neoplasm of liver and intrahepatic bile duct: Secondary | ICD-10-CM

## 2015-04-11 DIAGNOSIS — Z006 Encounter for examination for normal comparison and control in clinical research program: Secondary | ICD-10-CM | POA: Insufficient documentation

## 2015-04-11 DIAGNOSIS — Z79899 Other long term (current) drug therapy: Secondary | ICD-10-CM | POA: Insufficient documentation

## 2015-04-11 LAB — COMPREHENSIVE METABOLIC PANEL
ALBUMIN: 3.8 g/dL (ref 3.5–5.0)
ALK PHOS: 142 U/L — AB (ref 38–126)
ALT: 13 U/L — ABNORMAL LOW (ref 17–63)
AST: 18 U/L (ref 15–41)
Anion gap: 4 — ABNORMAL LOW (ref 5–15)
BILIRUBIN TOTAL: 0.6 mg/dL (ref 0.3–1.2)
BUN: 17 mg/dL (ref 6–20)
CO2: 27 mmol/L (ref 22–32)
CREATININE: 0.72 mg/dL (ref 0.61–1.24)
Calcium: 8.4 mg/dL — ABNORMAL LOW (ref 8.9–10.3)
Chloride: 105 mmol/L (ref 101–111)
GFR calc Af Amer: >60 mL/min (ref >60)
Glucose, Bld: 102 mg/dL — ABNORMAL HIGH (ref 65–99)
Potassium: 4.8 mmol/L (ref 3.5–5.1)
Sodium: 136 mmol/L (ref 135–145)
Total Protein: 7.2 g/dL (ref 6.5–8.1)

## 2015-04-11 LAB — CBC WITH DIFFERENTIAL/PLATELET
Basophils Absolute: 0 10*3/uL (ref 0–0.1)
Basophils Relative: 1 %
EOS PCT: 3 %
Eosinophils Absolute: 0.2 10*3/uL (ref 0–0.7)
HCT: 34 % — ABNORMAL LOW (ref 40.0–52.0)
HEMOGLOBIN: 10.9 g/dL — AB (ref 13.0–18.0)
Lymphocytes Relative: 48 %
Lymphs Abs: 3.8 10*3/uL — ABNORMAL HIGH (ref 1.0–3.6)
MCH: 27.7 pg (ref 26.0–34.0)
MCHC: 32.1 g/dL (ref 32.0–36.0)
MCV: 86.4 fL (ref 80.0–100.0)
MONO ABS: 0.9 10*3/uL (ref 0.2–1.0)
Monocytes Relative: 12 %
Neutro Abs: 2.7 10*3/uL (ref 1.4–6.5)
Neutrophils Relative %: 36 %
PLATELETS: 211 10*3/uL (ref 150–440)
RBC: 3.93 MIL/uL — ABNORMAL LOW (ref 4.40–5.90)
RDW: 18.2 % — ABNORMAL HIGH (ref 11.5–14.5)
WBC: 7.7 10*3/uL (ref 3.8–10.6)

## 2015-04-11 LAB — PSA: PSA: 44.81 ng/mL — ABNORMAL HIGH (ref 0.00–4.00)

## 2015-04-11 LAB — MISC LABCORP TEST (SEND OUT)

## 2015-04-11 MED ORDER — DEGARELIX ACETATE 80 MG ~~LOC~~ SOLR
80.0000 mg | Freq: Once | SUBCUTANEOUS | Status: AC
  Administered 2015-04-11: 80 mg via SUBCUTANEOUS
  Filled 2015-04-11: qty 4

## 2015-04-11 MED ORDER — FENTANYL 50 MCG/HR TD PT72: 50.0000 ug | MEDICATED_PATCH | TRANSDERMAL | Status: DC

## 2015-04-11 MED ORDER — PREDNISONE 5 MG PO TABS: 5.0000 mg | ORAL_TABLET | Freq: Two times a day (BID) | ORAL | Status: DC

## 2015-04-11 MED ORDER — DENOSUMAB 120 MG/1.7ML ~~LOC~~ SOLN
120.0000 mg | Freq: Once | SUBCUTANEOUS | Status: AC
  Administered 2015-04-11: 120 mg via SUBCUTANEOUS
  Filled 2015-04-11: qty 1.7

## 2015-04-11 MED ORDER — ABIRATERONE ACETATE 250 MG PO TABS: 1000.0000 mg | ORAL_TABLET | Freq: Every day | ORAL | Status: DC

## 2015-04-11 NOTE — Progress Notes (Signed)
Patient former smoker.  Does have living will. 

## 2015-04-11 NOTE — Progress Notes (Signed)
Sean Wolfe and his caregiver, Sean Wolfe arrive in clinic today to see Dr. Jeb Levering to follow up on his first month of dosing with Enzalutamide, Zytiga, and prednisone per the [facility] [phone] protocol. He is seen today at day 30 rather than day 29 and this will be his new schedule.  He reports that he is "feeling better every day".  His review of systems was negative with the exception of some mild transient "dizziness" Grade 1- unlikely related) on 03/13/2015 and  mild nausea (Grade 1- possibly related) on 03/30/2015 after taking the Enzalutamide and Prednisone in the morning. His labs were reviewed by Dr. Oliva Bustard.  He was issued 120 tablets Enzalutamide at his last visit and has returned an empty bottle so he is on target with his dosing.  With the assistance of his caregiver his compliance is excellent.  He will return to clinic on April 25, 2015 for lab to check his LFT's, then again on June 30 for Cycle 3/Day 1.

## 2015-04-14 ENCOUNTER — Encounter: Payer: Self-pay | Admitting: Oncology

## 2015-04-14 NOTE — Progress Notes (Signed)
Cancer Center Progress Note  [Authored: 13-Apr-16 21:43]- for Visit: [id number], Complete, Revised, Signed in Full, General  HPI: Referred by [facility](29)  This 74 year old Male patient presents to the clinic for follow up (1)for metastatic prostate cancer(1)   [place] @ [facility] Telephone:(336) [phone]  Fax:(336) [place]: Nov 28, 1940  MR#: [id number]  QPR#:[id number]  Patient Care Team: No Pcp Per Patient as PCP - General (General Practice)  CHIEF COMPLAINT:  Chief Complaint  Patient presents with  . Follow-up    Oncology History   1.MRI scan on August 4 of 2015  revealed in your able liver lesion and multiple areas  of   abnormallity  in the bone suggestive off metastases  2.High PSA. 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  Patient has been started on [facility] (August, 2015) and Delton See, 4.patient started on [facility] protocol with Gillermina Phy nd ZYTIGA (May, 2016)     Prostate cancer metastatic to multiple sites   03/11/2015 Initial Diagnosis Prostate cancer metastatic to multiple sites    Oncology Flowsheet 03/13/2015 04/11/2015  degarelix (FIRMAGON) [place] 80 mg 80 mg  denosumab (XGEVA) [place] 120 mg 120 mg    INTERVAL HISTORY: HPI:   74 year old-year-old gentleman came today further followup.  Liver biopsy is consistent with prostate cancer.  patient tolerated  FIRMAGON oading does very well.  Significant improvement in bony pains.     No swelling of lower extremity.  No chills.  No fever .Patient is here for ongoing evaluation regarding carcinoma prostate  stage IV disease, No pain.  Appetite has been stable.  PSA is rising. No nausea vomiting diarrhea.pain is well controlled with fentanyl patch and breakthrough pain medication. Patient is here to discuss options for further treatment.  Patient is rising PSA. Patient has been randomized to getXTANDI  and ZYTIGA under [facility] protocol.  Will continue androgen deprivation therapy as well as Xgeva.  Informed consent has been obtained.  Patient and family a number of questions.  Discussion with the research nurse.  Pain is under better control (May of 2016) April 11, 2015 Patient came today further follow-up feeling better.  Abdominal pain is improved.  According to patient's wife has not used any pain medication other than the fentanyl patch.  No nausea.  No vomiting.  No diarrhea.  REVIEW OF SYSTEMS:   Review of Systems  Constitutional: Negative for fever, chills, weight loss, malaise/fatigue and diaphoresis.  HENT: Negative for hearing loss and nosebleeds.   Eyes: Negative for discharge and redness.  Respiratory: Negative for cough, hemoptysis and wheezing.   Cardiovascular: Negative for chest pain, palpitations, orthopnea, claudication, leg swelling and PND.  Gastrointestinal: Negative for heartburn, nausea, vomiting, abdominal pain, diarrhea, constipation, blood in stool and melena.  Genitourinary: Negative for dysuria, urgency, frequency and hematuria.  Musculoskeletal: Negative for myalgias, back pain, joint pain, falls and neck pain.  Skin: Negative.  Negative for itching and rash.  Neurological: Negative for dizziness, tingling, tremors, sensory change, speech change, focal weakness, seizures, loss of consciousness and headaches.  Psychiatric/Behavioral: Negative for depression, suicidal ideas, hallucinations, memory loss and substance abuse. The patient is not nervous/anxious and does not have insomnia.     As per HPI. Otherwise, a complete review of systems is negatve.  PAST MEDICAL HISTORY: Past Medical History  Diagnosis Date  . Prostate cancer   . Bone cancer   . Hyperlipidemia  PAST SURGICAL HISTORY: Past Surgical History  Procedure Laterality Date  . Ankle surgery Right     FAMILY HISTORY No family history on file.     HEALTH MAINTENANCE: History    Substance Use Topics  . Smoking status: Former Smoker -- 0.50 packs/day for 45 years    Types: Cigarettes    Quit date: 11/13/1991  . Smokeless tobacco: Not on file  . Alcohol Use: 29.4 oz/week    6 Cans of beer, 43 Shots of liquor per week      Allergies  Allergen Reactions  . No Known Allergies      OBJECTIVE: Filed Vitals:   04/11/15 0931  BP: 144/78  Pulse: 52  Temp: 95.8 F (35.4 C)     Body mass index is 21.7 kg/(m^2).    ECOG FS:1 - Symptomatic but completely ambulatory  Physical Exam  Constitutional: No distress.  HENT:  Head: Normocephalic and atraumatic.  Right Ear: External ear normal.  Left Ear: External ear normal.  Nose: Nose normal.  Mouth/Throat: Oropharynx is clear and moist.  Eyes: Conjunctivae and EOM are normal. Pupils are equal, round, and reactive to light.  Neck: Normal range of motion. Neck supple. No JVD present. No tracheal deviation present. No thyromegaly present.  Cardiovascular: Normal rate, normal heart sounds and intact distal pulses.  Exam reveals no gallop and no friction rub.   No murmur heard. Pulmonary/Chest: No stridor. No respiratory distress. He has no wheezes. He has no rales. He exhibits no tenderness.  Abdominal: Soft. Bowel sounds are normal. He exhibits mass (LIVER IS PALPABLE 2 FINGER BELOW COSTAL MARGIN). He exhibits no distension. There is no tenderness. There is no rebound and no guarding.  Musculoskeletal: He exhibits no edema or tenderness.  Neurological: He displays normal reflexes. No cranial nerve deficit. He exhibits normal muscle tone. Coordination normal.  Skin: No rash noted. He is not diaphoretic. No erythema.  Psychiatric: Memory, affect and judgment normal.  Nursing note and vitals reviewed.    LAB RESULTS:     Component Value Date/Time   NA 136 04/11/2015 0856   NA 129* 02/22/2015 0932   K 4.8 04/11/2015 0856   K 4.4 02/22/2015 0932   CL 105 04/11/2015 0856   CL 96* 02/22/2015 0932   CO2 27  04/11/2015 0856   CO2 26 02/22/2015 0932   GLUCOSE 102* 04/11/2015 0856   GLUCOSE 111* 02/22/2015 0932   BUN 17 04/11/2015 0856   BUN 25* 02/22/2015 0932   CREATININE 0.72 04/11/2015 0856   CREATININE 0.96 02/22/2015 0932   CALCIUM 8.4* 04/11/2015 0856   CALCIUM 7.7* 02/22/2015 0932   PROT 7.2 04/11/2015 0856   PROT 8.0 02/22/2015 0932   ALBUMIN 3.8 04/11/2015 0856   ALBUMIN 3.3* 02/22/2015 0932   AST 18 04/11/2015 0856   AST 65* 02/22/2015 0932   ALT 13* 04/11/2015 0856   ALT 63 02/22/2015 0932   ALKPHOS 142* 04/11/2015 0856   ALKPHOS 256* 02/22/2015 0932   BILITOT 0.6 04/11/2015 0856   GFRNONAA >60 04/11/2015 0856   GFRNONAA >60 02/22/2015 0932   GFRAA >60 04/11/2015 0856   GFRAA >60 02/22/2015 0932    No results found for: SPEP, UPEP  Lab Results  Component Value Date   WBC 7.7 04/11/2015   NEUTROABS 2.7 04/11/2015   HGB 10.9* 04/11/2015   HCT 34.0* 04/11/2015   MCV 86.4 04/11/2015   PLT 211 04/11/2015      Chemistry      Component Value Date/Time  NA 136 04/11/2015 0856   NA 129* 02/22/2015 0932   K 4.8 04/11/2015 0856   K 4.4 02/22/2015 0932   CL 105 04/11/2015 0856   CL 96* 02/22/2015 0932   CO2 27 04/11/2015 0856   CO2 26 02/22/2015 0932   BUN 17 04/11/2015 0856   BUN 25* 02/22/2015 0932   CREATININE 0.72 04/11/2015 0856   CREATININE 0.96 02/22/2015 0932      Component Value Date/Time   CALCIUM 8.4* 04/11/2015 0856   CALCIUM 7.7* 02/22/2015 0932   ALKPHOS 142* 04/11/2015 0856   ALKPHOS 256* 02/22/2015 0932   AST 18 04/11/2015 0856   AST 65* 02/22/2015 0932   ALT 13* 04/11/2015 0856   ALT 63 02/22/2015 0932   BILITOT 0.6 04/11/2015 0856      Lab Results  Component Value Date   PSA 44.81* 04/11/2015     STUDIES: No results found.  ASSESSMENT:  Stage IV carcinoma prostate.  Castration resistant prostate cancer . Patient is on protocol.  Tolerating treatment very well. I will lab data has been reviewed. His general condition is  improved.  Patient's PSA has been declining Liver enzymes have been improving  Patient expressed understanding and was in agreement with this plan. He also understands that He can call clinic at any time with any questions, concerns, or complaints.    Prostate cancer metastatic to multiple sites   Staging form: Prostate, AJCC 7th Edition     Clinical: Stage IV (T3, N0, M1b, PSA: 20 or greater, Gleason 8-10 - Poorly differentiated/undifferentiated (marked anaplasia)) - Signed by Forest Gleason, MD on 03/17/2015   Forest Gleason, MD   04/14/2015 6:27 AM          Smoking History: Smoking History 1(1)Packs per day and Quit 1993 after 30 yr history.(1)                         )     Advance Directive:  Advance Directive ([place]) yes(1)   Do you want to revise or change your advance directive? No(1)

## 2015-04-25 ENCOUNTER — Inpatient Hospital Stay: Payer: Medicare Other

## 2015-04-25 DIAGNOSIS — C61 Malignant neoplasm of prostate: Secondary | ICD-10-CM

## 2015-04-25 LAB — HEPATIC FUNCTION PANEL
ALBUMIN: 3.6 g/dL (ref 3.5–5.0)
ALK PHOS: 133 U/L — AB (ref 38–126)
ALT: 11 U/L — ABNORMAL LOW (ref 17–63)
AST: 16 U/L (ref 15–41)
Bilirubin, Direct: <0.1 mg/dL — ABNORMAL LOW (ref 0.1–0.5)
TOTAL PROTEIN: 7.5 g/dL (ref 6.5–8.1)
Total Bilirubin: 0.3 mg/dL (ref 0.3–1.2)

## 2015-05-01 ENCOUNTER — Ambulatory Visit
Admission: RE | Admit: 2015-05-01 | Discharge: 2015-05-01 | Disposition: A | Discharge status: Home / Self Care | Payer: Medicare Other | Source: Ambulatory Visit | Attending: Oncology | Admitting: Oncology

## 2015-05-01 ENCOUNTER — Encounter
Admission: RE | Admit: 2015-05-01 | Discharge: 2015-05-01 | Disposition: A | Discharge status: Home / Self Care | Payer: Medicare Other | Source: Ambulatory Visit | Attending: Oncology | Admitting: Oncology

## 2015-05-01 DIAGNOSIS — C7951 Secondary malignant neoplasm of bone: Secondary | ICD-10-CM | POA: Insufficient documentation

## 2015-05-01 DIAGNOSIS — C61 Malignant neoplasm of prostate: Secondary | ICD-10-CM | POA: Insufficient documentation

## 2015-05-01 MED ORDER — TECHNETIUM TC 99M MEDRONATE IV KIT
25.0000 | PACK | Freq: Once | INTRAVENOUS | Status: AC | PRN
  Administered 2015-05-01: 22.33 via INTRAVENOUS

## 2015-05-06 ENCOUNTER — Ambulatory Visit
Admission: RE | Admit: 2015-05-06 | Discharge: 2015-05-06 | Disposition: A | Discharge status: Home / Self Care | Payer: Medicare Other | Source: Ambulatory Visit | Attending: Oncology | Admitting: Oncology

## 2015-05-06 DIAGNOSIS — K8020 Calculus of gallbladder without cholecystitis without obstruction: Secondary | ICD-10-CM | POA: Diagnosis not present

## 2015-05-06 DIAGNOSIS — R918 Other nonspecific abnormal finding of lung field: Secondary | ICD-10-CM | POA: Insufficient documentation

## 2015-05-06 DIAGNOSIS — C61 Malignant neoplasm of prostate: Secondary | ICD-10-CM

## 2015-05-06 DIAGNOSIS — C7951 Secondary malignant neoplasm of bone: Secondary | ICD-10-CM | POA: Diagnosis not present

## 2015-05-06 DIAGNOSIS — I700 Atherosclerosis of aorta: Secondary | ICD-10-CM | POA: Insufficient documentation

## 2015-05-06 DIAGNOSIS — M479 Spondylosis, unspecified: Secondary | ICD-10-CM | POA: Insufficient documentation

## 2015-05-06 DIAGNOSIS — C787 Secondary malignant neoplasm of liver and intrahepatic bile duct: Secondary | ICD-10-CM | POA: Diagnosis not present

## 2015-05-06 MED ORDER — IOHEXOL 350 MG/ML SOLN
100.0000 mL | Freq: Once | INTRAVENOUS | Status: AC | PRN
  Administered 2015-05-06: 100 mL via INTRAVENOUS

## 2015-05-08 ENCOUNTER — Other Ambulatory Visit: Payer: Self-pay | Admitting: *Deleted

## 2015-05-08 ENCOUNTER — Encounter: Payer: Self-pay | Admitting: *Deleted

## 2015-05-08 ENCOUNTER — Encounter: Payer: Self-pay | Admitting: Oncology

## 2015-05-08 DIAGNOSIS — C61 Malignant neoplasm of prostate: Secondary | ICD-10-CM

## 2015-05-09 ENCOUNTER — Inpatient Hospital Stay: Payer: Medicare Other

## 2015-05-09 ENCOUNTER — Encounter: Payer: Self-pay | Admitting: Oncology

## 2015-05-09 ENCOUNTER — Encounter: Payer: Self-pay | Admitting: *Deleted

## 2015-05-09 ENCOUNTER — Inpatient Hospital Stay (HOSPITAL_BASED_OUTPATIENT_CLINIC_OR_DEPARTMENT_OTHER): Payer: Medicare Other | Admitting: Oncology

## 2015-05-09 VITALS — BP 145/70 | HR 63 | Temp 96.6°F | Wt 155.9 lb

## 2015-05-09 DIAGNOSIS — C787 Secondary malignant neoplasm of liver and intrahepatic bile duct: Secondary | ICD-10-CM | POA: Diagnosis not present

## 2015-05-09 DIAGNOSIS — C61 Malignant neoplasm of prostate: Secondary | ICD-10-CM

## 2015-05-09 DIAGNOSIS — C7951 Secondary malignant neoplasm of bone: Secondary | ICD-10-CM

## 2015-05-09 DIAGNOSIS — Z79899 Other long term (current) drug therapy: Secondary | ICD-10-CM | POA: Diagnosis not present

## 2015-05-09 LAB — COMPREHENSIVE METABOLIC PANEL
ALBUMIN: 3.9 g/dL (ref 3.5–5.0)
ALK PHOS: 131 U/L — AB (ref 38–126)
ALT: 12 U/L — ABNORMAL LOW (ref 17–63)
ANION GAP: 5 (ref 5–15)
AST: 20 U/L (ref 15–41)
BUN: 19 mg/dL (ref 6–20)
CHLORIDE: 104 mmol/L (ref 101–111)
CO2: 27 mmol/L (ref 22–32)
CREATININE: 0.99 mg/dL (ref 0.61–1.24)
Calcium: 8.4 mg/dL — ABNORMAL LOW (ref 8.9–10.3)
Glucose, Bld: 103 mg/dL — ABNORMAL HIGH (ref 65–99)
Potassium: 5.1 mmol/L (ref 3.5–5.1)
SODIUM: 136 mmol/L (ref 135–145)
TOTAL PROTEIN: 7.6 g/dL (ref 6.5–8.1)
Total Bilirubin: 0.5 mg/dL (ref 0.3–1.2)

## 2015-05-09 LAB — CBC WITH DIFFERENTIAL/PLATELET
Basophils Absolute: 0 10*3/uL (ref 0–0.1)
Basophils Relative: 0 %
EOS PCT: 2 %
Eosinophils Absolute: 0.1 10*3/uL (ref 0–0.7)
HCT: 36.2 % — ABNORMAL LOW (ref 40.0–52.0)
HEMOGLOBIN: 11.5 g/dL — AB (ref 13.0–18.0)
LYMPHS ABS: 2.8 10*3/uL (ref 1.0–3.6)
LYMPHS PCT: 39 %
MCH: 28 pg (ref 26.0–34.0)
MCHC: 31.7 g/dL — ABNORMAL LOW (ref 32.0–36.0)
MCV: 88.2 fL (ref 80.0–100.0)
MONO ABS: 0.8 10*3/uL (ref 0.2–1.0)
Monocytes Relative: 11 %
NEUTROS ABS: 3.5 10*3/uL (ref 1.4–6.5)
Neutrophils Relative %: 48 %
PLATELETS: 222 10*3/uL (ref 150–440)
RBC: 4.11 MIL/uL — AB (ref 4.40–5.90)
RDW: 18.8 % — AB (ref 11.5–14.5)
WBC: 7.2 10*3/uL (ref 3.8–10.6)

## 2015-05-09 LAB — PSA: PSA: 42.96 ng/mL — ABNORMAL HIGH (ref 0.00–4.00)

## 2015-05-09 MED ORDER — DEGARELIX ACETATE 80 MG ~~LOC~~ SOLR
80.0000 mg | Freq: Once | SUBCUTANEOUS | Status: AC
  Administered 2015-05-09: 80 mg via SUBCUTANEOUS
  Filled 2015-05-09: qty 4

## 2015-05-09 MED ORDER — PREDNISONE 5 MG PO TABS: 5.0000 mg | ORAL_TABLET | Freq: Two times a day (BID) | ORAL | Status: DC

## 2015-05-09 MED ORDER — INV-ENZALUTAMIDE 40 MG CAPS #120 ALLIANCE A031201: 160.0000 mg | ORAL_CAPSULE | Freq: Every day | ORAL | Status: DC

## 2015-05-09 MED ORDER — ABIRATERONE ACETATE 250 MG PO TABS: 1000.0000 mg | ORAL_TABLET | Freq: Every day | ORAL | Status: DC

## 2015-05-09 MED ORDER — DENOSUMAB 120 MG/1.7ML ~~LOC~~ SOLN
120.0000 mg | Freq: Once | SUBCUTANEOUS | Status: AC
  Administered 2015-05-09: 120 mg via SUBCUTANEOUS
  Filled 2015-05-09: qty 1.7

## 2015-05-09 NOTE — Progress Notes (Signed)
[facility] [phone] Research Encounter Note, Day 1/Cycle 3: Pt. Arrives in clinic today accompanied by his friend "AJ" for results of his bone and CT scans and to replenish his Enzalutamide.  He is in good spirits and denies pain. He denies any adverse events as per the  Solicited Adverse Events Form.  His performance status = 1. He reports an improved appetite.  Since his start date of 03/13/2015 his weight has gone from 65.7 to 70.7 kg.  His ALK PHOS is down to 131.  His  PSA has dropped from 311 on 03/13/2015  to 42.96 today.  His glucose is 103 but it is  a non-fasting value, his calcium continues to be decreased but this is considered clinically insignificant. His CT scan demonstrates a partial response of his target lung metastasis and his bone scan is unchanged.    I reviewed his Enzalutamide diary and he recorded taking 4 tablets X 30 days = 120 tablets (120 tablets per bottle) yet he has 8 capsules left in his bottle. He and his caregiver are working hard to keep up with dosing and recording his medications.  The bottle with extra capsules has been returned to pharmacy and he has received a new bottle today.  He is not having any problems receiving his shipment of abiraterone. His Pharmacokinetic questionnaire indicates he is eating a bit too soon after taking his medication.  I ask him to wait two hours to eat after taking his medication.  He will report to chemo treatment area for his [place]. He knows that he may call with questions or concerns at any time.  He will return to clinic in 1 month on June 06, 2015 for Cycle 4 and follow-up.    Jake Samples, RN

## 2015-05-09 NOTE — Progress Notes (Signed)
Cancer Center Progress Note  [Authored: 13-Apr-16 21:43]- for Visit: [id number], Complete, Revised, Signed in Full, General  HPI: Referred by [facility]([id number])  This 74 year old Male patient presents to the clinic for follow up (1)for metastatic prostate cancer(1)   [place] @ [facility] [phone]  Fax:([phone]) [place]: [birth year]  MR#: [id number]  YTK#:[id number]  Patient Care Team: Pcp Not In System as PCP - General  CHIEF COMPLAINT:  Chief Complaint  Patient presents with  . Follow-up    Oncology History   1.MRI scan on August 4 of 2015  revealed in your able liver lesion and multiple areas  of   abnormallity  in the bone suggestive off metastases  2.High PSA. 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  Patient has been started on [facility] (August, 2015) and Delton See, 4.patient started on [facility] protocol with Gillermina Phy nd ZYTIGA (May, 2016)     Prostate cancer metastatic to multiple sites   03/11/2015 Initial Diagnosis Prostate cancer metastatic to multiple sites    Oncology Flowsheet 03/13/2015 04/11/2015 05/09/2015  degarelix (FIRMAGON) [place] 80 mg 80 mg 80 mg  denosumab (XGEVA) [place] 120 mg 120 mg 120 mg    INTERVAL HISTORY: HPI:   74 year old-year-old gentleman came today further followup.  Liver biopsy is consistent with prostate cancer.  patient tolerated  FIRMAGON oading does very well.  Significant improvement in bony pains.     No swelling of lower extremity.  No chills.  No fever .Patient is here for ongoing evaluation regarding carcinoma prostate  stage IV disease, No pain.  Appetite has been stable.  PSA is rising. No nausea vomiting diarrhea.pain is well controlled with fentanyl patch and breakthrough pain medication. Patient is here to discuss options for further treatment.  Patient is rising PSA. Patient has been randomized to  getXTANDI and ZYTIGA under [facility] protocol.  Will continue androgen deprivation therapy as well as Xgeva.  Informed consent has been obtained.  Patient and family a number of questions.  Discussion with the research nurse.  Pain is under better control (May of 2016) April 11, 2015 Patient came today further follow-up feeling better.  Abdominal pain is improved.  According to patient's wife has not used any pain medication other than the fentanyl patch.  No nausea.  No vomiting.  No diarrhea. June 30th , 2016 Patient is here for ongoing evaluation Appetite is improving.  Abdominal pain is improved.  Patient is tolerating protocol medicine very well. No nausea no vomiting.  Patient had a repeat CT scan be so significant response PSA is gradually declining.  REVIEW OF SYSTEMS:   Review of Systems  Constitutional: Negative for fever, chills, weight loss, malaise/fatigue and diaphoresis.  HENT: Negative for hearing loss and nosebleeds.   Eyes: Negative for discharge and redness.  Respiratory: Negative for cough, hemoptysis and wheezing.   Cardiovascular: Negative for chest pain, palpitations, orthopnea, claudication, leg swelling and PND.  Gastrointestinal: Negative for heartburn, nausea, vomiting, abdominal pain, diarrhea, constipation, blood in stool and melena.  Genitourinary: Negative for dysuria, urgency, frequency and hematuria.  Musculoskeletal: Negative for myalgias, back pain, joint pain, falls and neck pain.  Skin: Negative.  Negative for itching and rash.  Neurological: Negative for dizziness, tingling, tremors, sensory change, speech change, focal weakness, seizures, loss of consciousness and headaches.  Psychiatric/Behavioral: Negative for depression, suicidal ideas, hallucinations, memory loss and substance abuse.  The patient is not nervous/anxious and does not have insomnia.     As per HPI. Otherwise, a complete review of systems is negatve.  PAST MEDICAL HISTORY: Past Medical  History  Diagnosis Date  . Hyperlipidemia   . Prostate cancer     2014  . Bone cancer     PAST SURGICAL HISTORY: Past Surgical History  Procedure Laterality Date  . Ankle surgery Right     FAMILY HISTORY No family history on file.     HEALTH MAINTENANCE: History  Substance Use Topics  . Smoking status: Former Smoker -- 0.50 packs/day for 45 years    Types: Cigarettes    Quit date: 11/13/1991  . Smokeless tobacco: Not on file  . Alcohol Use: 29.4 oz/week    6 Cans of beer, 43 Shots of liquor per week      Allergies  Allergen Reactions  . No Known Allergies      OBJECTIVE: Filed Vitals:   05/09/15 1113  BP: 145/70  Pulse: 63  Temp: 96.6 F (35.9 C)     Body mass index is 22.36 kg/(m^2).    ECOG FS:1 - Symptomatic but completely ambulatory  Physical Exam  Constitutional: No distress.  HENT:  Head: Normocephalic and atraumatic.  Right Ear: External ear normal.  Left Ear: External ear normal.  Nose: Nose normal.  Mouth/Throat: Oropharynx is clear and moist.  Eyes: Conjunctivae and EOM are normal. Pupils are equal, round, and reactive to light.  Neck: Normal range of motion. Neck supple. No JVD present. No tracheal deviation present. No thyromegaly present.  Cardiovascular: Normal rate, normal heart sounds and intact distal pulses.  Exam reveals no gallop and no friction rub.   No murmur heard. Pulmonary/Chest: No stridor. No respiratory distress. He has no wheezes. He has no rales. He exhibits no tenderness.  Abdominal: Soft. Bowel sounds are normal. He exhibits mass (LIVER IS PALPABLE 2 FINGER BELOW COSTAL MARGIN). He exhibits no distension. There is no tenderness. There is no rebound and no guarding.  Musculoskeletal: He exhibits no edema or tenderness.  Neurological: He displays normal reflexes. No cranial nerve deficit. He exhibits normal muscle tone. Coordination normal.  Skin: No rash noted. He is not diaphoretic. No erythema.  Psychiatric: Memory,  affect and judgment normal.  Nursing note and vitals reviewed.    LAB RESULTS:     Component Value Date/Time   NA 136 05/09/2015 1052   NA 129* 02/22/2015 0932   K 5.1 05/09/2015 1052   K 4.4 02/22/2015 0932   CL 104 05/09/2015 1052   CL 96* 02/22/2015 0932   CO2 27 05/09/2015 1052   CO2 26 02/22/2015 0932   GLUCOSE 103* 05/09/2015 1052   GLUCOSE 111* 02/22/2015 0932   BUN 19 05/09/2015 1052   BUN 25* 02/22/2015 0932   CREATININE 0.99 05/09/2015 1052   CREATININE 0.96 02/22/2015 0932   CALCIUM 8.4* 05/09/2015 1052   CALCIUM 7.7* 02/22/2015 0932   PROT 7.6 05/09/2015 1052   PROT 8.0 02/22/2015 0932   ALBUMIN 3.9 05/09/2015 1052   ALBUMIN 3.3* 02/22/2015 0932   AST 20 05/09/2015 1052   AST 65* 02/22/2015 0932   ALT 12* 05/09/2015 1052   ALT 63 02/22/2015 0932   ALKPHOS 131* 05/09/2015 1052   ALKPHOS 256* 02/22/2015 0932   BILITOT 0.5 05/09/2015 1052   GFRNONAA >60 05/09/2015 1052   GFRNONAA >60 02/22/2015 0932   GFRAA >60 05/09/2015 1052   GFRAA >60 02/22/2015 0932    No results  found for: SPEP, UPEP  Lab Results  Component Value Date   WBC 7.2 05/09/2015   NEUTROABS 3.5 05/09/2015   HGB 11.5* 05/09/2015   HCT 36.2* 05/09/2015   MCV 88.2 05/09/2015   PLT 222 05/09/2015      Chemistry      Component Value Date/Time   NA 136 05/09/2015 1052   NA 129* 02/22/2015 0932   K 5.1 05/09/2015 1052   K 4.4 02/22/2015 0932   CL 104 05/09/2015 1052   CL 96* 02/22/2015 0932   CO2 27 05/09/2015 1052   CO2 26 02/22/2015 0932   BUN 19 05/09/2015 1052   BUN 25* 02/22/2015 0932   CREATININE 0.99 05/09/2015 1052   CREATININE 0.96 02/22/2015 0932      Component Value Date/Time   CALCIUM 8.4* 05/09/2015 1052   CALCIUM 7.7* 02/22/2015 0932   ALKPHOS 131* 05/09/2015 1052   ALKPHOS 256* 02/22/2015 0932   AST 20 05/09/2015 1052   AST 65* 02/22/2015 0932   ALT 12* 05/09/2015 1052   ALT 63 02/22/2015 0932   BILITOT 0.5 05/09/2015 1052      Lab Results  Component  Value Date   PSA 42.96* 05/09/2015     STUDIES: Ct Chest W Contrast  05/06/2015   CLINICAL DATA:  History of prostate cancer in 2014 on research chemotherapy. Persist protocol. Subsequent encounter.  EXAM: CT CHEST, ABDOMEN, AND PELVIS WITH CONTRAST  TECHNIQUE: Multidetector CT imaging of the chest, abdomen and pelvis was performed following the standard protocol during bolus administration of intravenous contrast.  CONTRAST:  152mL OMNIPAQUE IOHEXOL 350 MG/ML SOLN  COMPARISON:  Whole body bone scan 05/01/2015.  CTs 02/22/2015.  FINDINGS: RECIST 1.1  Target Lesions:  1. Inferior right hepatic lobe lesion measures 1.6 cm on image 67 (previously 2.4 cm). 2. Medial segment left hepatic lobe lesion measures 1.7 cm on image 63 (previously 3.0 cm). 3. Previously identified right upper lobe pulmonary nodule is no longer measurable. Non-target Lesions:  1. Previously demonstrated 3 mm left lower lobe nodule is no longer measurable. 2. Previously demonstrated 5 mm left lower lobe nodule is unchanged, measuring 5 mm on image 42. 3. Previously demonstrated 6 mm left lower lobe nodule measures 5 mm on image 33. 4. Gastrohepatic ligament lymph node measures 11 mm on image 58 (previously 13 mm). 5. Left pelvic sidewall lymph node measures 6 mm on image 97 (previously 8 mm).  CT CHEST FINDINGS  Mediastinum/Nodes: There are no enlarged mediastinal, hilar or axillary lymph nodes. Scattered small mediastinal lymph nodes are unchanged. There is a 12 mm left retrocrural low-density lesion on image 50 which is unchanged. The thyroid gland, trachea and esophagus demonstrate no significant findings. The heart size is normal. There is no pericardial effusion.Atherosclerosis of the aorta, great vessels and coronary arteries again noted.  Lungs/Pleura: There is no pleural effusion.Mild emphysema and scattered pulmonary nodules are again noted. The previously demonstrated sub solid right upper lobe density is no longer measurable,  likely a focus of resolving inflammation. Small subpleural right lower lobe nodule on image number 40 is unchanged. Scattered small left-sided pulmonary nodules are similar to the prior study as well, with non target lesions reported above. The largest nodules are in the left lower lobe, measuring 5 mm on image 33 and 42. No new nodules identified.  Musculoskeletal/Chest wall: Widespread blastic metastatic disease throughout the spine, sternum, scapulae, left clavicle and ribs is again noted. Chronic T5 fracture appears unchanged.  CT ABDOMEN AND PELVIS FINDINGS  Hepatobiliary: Previously demonstrated multifocal hepatic metastatic disease has improved. The lesions are smaller with lower density consistent with response to treatment. Index lesions reported above include a 1.7 cm lesion in the medial segment of the left hepatic lobe (image 63) and a 1.6 cm lesion inferiorly in the right hepatic lobe on image 67. No enlarging lesions identified. Multiple calcified gallstones are noted. There is no biliary dilatation.  Pancreas: Stable appearance. There is stable mild pancreatic ductal dilatation.  Spleen: Normal in size without focal abnormality.  Adrenals/Urinary Tract: Both adrenal glands appear normal.The kidneys appear stable with small cortical cysts bilaterally. No hydronephrosis. The bladder appears unremarkable.  Stomach/Bowel: No evidence of bowel wall thickening, distention or surrounding inflammatory change.Mild colonic diverticulosis noted.  Vascular/Lymphatic: 11 mm gastrohepatic lymph node on image 58 previously measured 13 mm. Additional smaller lymph nodes within the porta hepatis are stable. Posterior left pelvic sidewall node measures 6 mm on image 97 (previously 8 mm). There is diffuse atherosclerosis of the aorta, its branches and the iliac arteries.  Reproductive: The prostate gland appears stable without focal abnormality.  Other: Probable sequela of subcutaneous injections within the anterior  abdominal wall.  Musculoskeletal: Grossly stable widespread blastic metastases to the spine, pelvis and proximal femurs. Chronic superior endplate compression deformity at L1 appears unchanged. No new fractures identified. There is multilevel spondylosis.  IMPRESSION: 1. Interval response to treatment in the multifocal hepatic metastases. 2. Indeterminate pulmonary nodules are similar to the prior examination. A previously noted ground-glass density in the right upper lobe has resolved. 3. Widespread osseous metastatic disease, not grossly changed. 4. Other findings of potential clinical significance include diffuse atherosclerosis, cholelithiasis and diffuse spondylosis.   Electronically Signed   By: Richardean Sale M.D.   On: 05/06/2015 10:09   Nm Bone Scan Whole Body  05/02/2015   CLINICAL DATA:  mets prostate ca-[facility] [id number] protocol patient  EXAM: NUCLEAR MEDICINE WHOLE BODY BONE SCAN  TECHNIQUE: Whole body anterior and posterior images were obtained approximately 3 hours after intravenous injection of radiopharmaceutical.  RADIOPHARMACEUTICALS:  22.3 mCi Technetium-85m MDP IV  COMPARISON:  Bone scan 02/01/2015, CT scan 02/22/2015  PCWG2 Measurements for Bone Metastasis:  1. No evidence of new skeletal lesions. 2. Widespread skeletal metastasis unchanged from baseline exam of 02/01/2015  FINDINGS: There is again demonstrated widespread skeletal metastasis. Multiple lesions in the proximal right femur are unchanged. Lesions within left and right sacral ala are not significant changed. Multiple lesions through the thoracic and lumbar spine are unchanged. Mottled appearance to the ribs suggesting metastasis are unchanged. Lesions in the left and right scapulae are unchanged.  Of note there is asymmetric activity within the bladder on the right.  IMPRESSION: 1. Widespread skeletal metastasis without new lesions identified. 2. Metastatic sites in occluded proximal right femur, pelvis, spine, ribs, and  scapula.   Electronically Signed   By: Suzy Bouchard M.D.   On: 05/02/2015 13:48   Ct Abdomen Pelvis W Contrast  05/06/2015   CLINICAL DATA:  History of prostate cancer in 2014 on research chemotherapy. Persist protocol. Subsequent encounter.  EXAM: CT CHEST, ABDOMEN, AND PELVIS WITH CONTRAST  TECHNIQUE: Multidetector CT imaging of the chest, abdomen and pelvis was performed following the standard protocol during bolus administration of intravenous contrast.  CONTRAST:  132mL OMNIPAQUE IOHEXOL 350 MG/ML SOLN  COMPARISON:  Whole body bone scan 05/01/2015.  CTs 02/22/2015.  FINDINGS: RECIST 1.1  Target Lesions:  1. Inferior right hepatic lobe lesion measures 1.6 cm on image 67 (previously  2.4 cm). 2. Medial segment left hepatic lobe lesion measures 1.7 cm on image 63 (previously 3.0 cm). 3. Previously identified right upper lobe pulmonary nodule is no longer measurable. Non-target Lesions:  1. Previously demonstrated 3 mm left lower lobe nodule is no longer measurable. 2. Previously demonstrated 5 mm left lower lobe nodule is unchanged, measuring 5 mm on image 42. 3. Previously demonstrated 6 mm left lower lobe nodule measures 5 mm on image 33. 4. Gastrohepatic ligament lymph node measures 11 mm on image 58 (previously 13 mm). 5. Left pelvic sidewall lymph node measures 6 mm on image 97 (previously 8 mm).  CT CHEST FINDINGS  Mediastinum/Nodes: There are no enlarged mediastinal, hilar or axillary lymph nodes. Scattered small mediastinal lymph nodes are unchanged. There is a 12 mm left retrocrural low-density lesion on image 50 which is unchanged. The thyroid gland, trachea and esophagus demonstrate no significant findings. The heart size is normal. There is no pericardial effusion.Atherosclerosis of the aorta, great vessels and coronary arteries again noted.  Lungs/Pleura: There is no pleural effusion.Mild emphysema and scattered pulmonary nodules are again noted. The previously demonstrated sub solid right upper  lobe density is no longer measurable, likely a focus of resolving inflammation. Small subpleural right lower lobe nodule on image number 40 is unchanged. Scattered small left-sided pulmonary nodules are similar to the prior study as well, with non target lesions reported above. The largest nodules are in the left lower lobe, measuring 5 mm on image 33 and 42. No new nodules identified.  Musculoskeletal/Chest wall: Widespread blastic metastatic disease throughout the spine, sternum, scapulae, left clavicle and ribs is again noted. Chronic T5 fracture appears unchanged.  CT ABDOMEN AND PELVIS FINDINGS  Hepatobiliary: Previously demonstrated multifocal hepatic metastatic disease has improved. The lesions are smaller with lower density consistent with response to treatment. Index lesions reported above include a 1.7 cm lesion in the medial segment of the left hepatic lobe (image 63) and a 1.6 cm lesion inferiorly in the right hepatic lobe on image 67. No enlarging lesions identified. Multiple calcified gallstones are noted. There is no biliary dilatation.  Pancreas: Stable appearance. There is stable mild pancreatic ductal dilatation.  Spleen: Normal in size without focal abnormality.  Adrenals/Urinary Tract: Both adrenal glands appear normal.The kidneys appear stable with small cortical cysts bilaterally. No hydronephrosis. The bladder appears unremarkable.  Stomach/Bowel: No evidence of bowel wall thickening, distention or surrounding inflammatory change.Mild colonic diverticulosis noted.  Vascular/Lymphatic: 11 mm gastrohepatic lymph node on image 58 previously measured 13 mm. Additional smaller lymph nodes within the porta hepatis are stable. Posterior left pelvic sidewall node measures 6 mm on image 97 (previously 8 mm). There is diffuse atherosclerosis of the aorta, its branches and the iliac arteries.  Reproductive: The prostate gland appears stable without focal abnormality.  Other: Probable sequela of  subcutaneous injections within the anterior abdominal wall.  Musculoskeletal: Grossly stable widespread blastic metastases to the spine, pelvis and proximal femurs. Chronic superior endplate compression deformity at L1 appears unchanged. No new fractures identified. There is multilevel spondylosis.  IMPRESSION: 1. Interval response to treatment in the multifocal hepatic metastases. 2. Indeterminate pulmonary nodules are similar to the prior examination. A previously noted ground-glass density in the right upper lobe has resolved. 3. Widespread osseous metastatic disease, not grossly changed. 4. Other findings of potential clinical significance include diffuse atherosclerosis, cholelithiasis and diffuse spondylosis.   Electronically Signed   By: Richardean Sale M.D.   On: 05/06/2015 10:09    ASSESSMENT:  Stage IV carcinoma prostate.  Castration resistant prostate cancer . Patient is on protocol.  Tolerating treatment very well. I will lab data has been reviewed. His general condition is improved.  Patient's PSA has been declining Liver enzymes have been improving Ct  scan has been reviewed and shows significant response measurement reveals partial response.  Will continue XGEVA and [place]   Patient expressed understanding and was in agreement with this plan. He also understands that He can call clinic at any time with any questions, concerns, or complaints.    Prostate cancer metastatic to multiple sites   Staging form: Prostate, AJCC 7th Edition     Clinical: Stage IV (T3, N0, M1b, PSA: 20 or greater, Gleason 8-10 - Poorly differentiated/undifferentiated (marked anaplasia)) - Signed by Forest Gleason, MD on 03/17/2015   Forest Gleason, MD   05/09/2015 11:11 PM          Smoking History: Smoking History 1(1)Packs per day and Quit 1993 after 30 yr history.(1)                         )     Advance Directive:  Advance Directive ([place]) yes(1)   Do you want  to revise or change your advance directive? No(1)

## 2015-05-09 NOTE — Progress Notes (Signed)
Patient does not have living will.  Former smoker. 

## 2015-05-23 ENCOUNTER — Inpatient Hospital Stay: Payer: Medicare Other | Attending: Oncology

## 2015-05-23 ENCOUNTER — Telehealth: Payer: Self-pay | Admitting: *Deleted

## 2015-05-23 DIAGNOSIS — C61 Malignant neoplasm of prostate: Secondary | ICD-10-CM | POA: Diagnosis not present

## 2015-05-23 DIAGNOSIS — Z006 Encounter for examination for normal comparison and control in clinical research program: Secondary | ICD-10-CM | POA: Diagnosis not present

## 2015-05-23 DIAGNOSIS — Z79818 Long term (current) use of other agents affecting estrogen receptors and estrogen levels: Secondary | ICD-10-CM | POA: Insufficient documentation

## 2015-05-23 DIAGNOSIS — C7951 Secondary malignant neoplasm of bone: Secondary | ICD-10-CM | POA: Diagnosis not present

## 2015-05-23 DIAGNOSIS — Z87891 Personal history of nicotine dependence: Secondary | ICD-10-CM | POA: Diagnosis not present

## 2015-05-23 DIAGNOSIS — C787 Secondary malignant neoplasm of liver and intrahepatic bile duct: Secondary | ICD-10-CM | POA: Diagnosis not present

## 2015-05-23 DIAGNOSIS — E785 Hyperlipidemia, unspecified: Secondary | ICD-10-CM | POA: Insufficient documentation

## 2015-05-23 LAB — HEPATIC FUNCTION PANEL
ALT: 12 U/L — ABNORMAL LOW (ref 17–63)
AST: 19 U/L (ref 15–41)
Albumin: 3.9 g/dL (ref 3.5–5.0)
Alkaline Phosphatase: 118 U/L (ref 38–126)
Bilirubin, Direct: <0.1 mg/dL — ABNORMAL LOW (ref 0.1–0.5)
TOTAL PROTEIN: 8 g/dL (ref 6.5–8.1)
Total Bilirubin: 0.5 mg/dL (ref 0.3–1.2)

## 2015-05-23 MED ORDER — FENTANYL 50 MCG/HR TD PT72: 50.0000 ug | MEDICATED_PATCH | TRANSDERMAL | Status: DC

## 2015-05-23 NOTE — Telephone Encounter (Signed)
-----   Message from Jake Samples, RN sent at 05/23/2015 10:47 AM EDT ----- Regarding: Duragesic patch prescription Hi Hildred Alamin,  If you could get Dr. Oliva Bustard to write a prescription for Mr. Field, I will pick it up and see that he gets it at 2:30 pm when he comes for LFT lab draw.  Thanks, Truman Hayward

## 2015-05-23 NOTE — Telephone Encounter (Signed)
rx for fentanyl patch ready for pick. rx given to Truman Hayward, RN to give to patient.

## 2015-05-30 ENCOUNTER — Telehealth: Payer: Self-pay | Admitting: *Deleted

## 2015-05-30 DIAGNOSIS — C61 Malignant neoplasm of prostate: Secondary | ICD-10-CM

## 2015-05-30 MED ORDER — ABIRATERONE ACETATE 250 MG PO TABS: 1000.0000 mg | ORAL_TABLET | Freq: Every day | ORAL | Status: DC

## 2015-05-30 NOTE — Telephone Encounter (Signed)
Rx for zytiga [facility]ed [facility] by Truman Hayward, RN.

## 2015-05-31 MED ORDER — ABIRATERONE ACETATE 250 MG PO TABS: 1000.0000 mg | ORAL_TABLET | Freq: Every day | ORAL | Status: DC

## 2015-05-31 NOTE — Addendum Note (Signed)
Addended by: Telford Nab on: 05/31/2015 11:01 AM   Modules accepted: Orders, Medications

## 2015-06-03 ENCOUNTER — Other Ambulatory Visit: Payer: Self-pay | Admitting: *Deleted

## 2015-06-03 DIAGNOSIS — C61 Malignant neoplasm of prostate: Secondary | ICD-10-CM

## 2015-06-06 ENCOUNTER — Inpatient Hospital Stay (HOSPITAL_BASED_OUTPATIENT_CLINIC_OR_DEPARTMENT_OTHER): Payer: Medicare Other | Admitting: Oncology

## 2015-06-06 ENCOUNTER — Inpatient Hospital Stay: Payer: Medicare Other

## 2015-06-06 ENCOUNTER — Encounter: Payer: Self-pay | Admitting: *Deleted

## 2015-06-06 VITALS — BP 176/79 | HR 68 | Temp 97.0°F | Ht 70.0 in | Wt 155.4 lb

## 2015-06-06 DIAGNOSIS — C61 Malignant neoplasm of prostate: Secondary | ICD-10-CM | POA: Diagnosis not present

## 2015-06-06 LAB — CBC WITH DIFFERENTIAL/PLATELET
Basophils Absolute: 0 10*3/uL (ref 0–0.1)
Basophils Relative: 1 %
EOS ABS: 0.1 10*3/uL (ref 0–0.7)
EOS PCT: 1 %
HEMATOCRIT: 39.3 % — AB (ref 40.0–52.0)
Hemoglobin: 12.7 g/dL — ABNORMAL LOW (ref 13.0–18.0)
Lymphocytes Relative: 37 %
Lymphs Abs: 3.4 10*3/uL (ref 1.0–3.6)
MCH: 28.6 pg (ref 26.0–34.0)
MCHC: 32.2 g/dL (ref 32.0–36.0)
MCV: 88.6 fL (ref 80.0–100.0)
MONOS PCT: 10 %
Monocytes Absolute: 0.9 10*3/uL (ref 0.2–1.0)
NEUTROS PCT: 51 %
Neutro Abs: 4.7 10*3/uL (ref 1.4–6.5)
Platelets: 247 10*3/uL (ref 150–440)
RBC: 4.44 MIL/uL (ref 4.40–5.90)
RDW: 17.3 % — ABNORMAL HIGH (ref 11.5–14.5)
WBC: 9.1 10*3/uL (ref 3.8–10.6)

## 2015-06-06 LAB — COMPREHENSIVE METABOLIC PANEL
ALBUMIN: 4 g/dL (ref 3.5–5.0)
ALK PHOS: 100 U/L (ref 38–126)
ALT: 11 U/L — AB (ref 17–63)
AST: 17 U/L (ref 15–41)
Anion gap: 4 — ABNORMAL LOW (ref 5–15)
BUN: 18 mg/dL (ref 6–20)
CALCIUM: 9.3 mg/dL (ref 8.9–10.3)
CO2: 30 mmol/L (ref 22–32)
Chloride: 104 mmol/L (ref 101–111)
Creatinine, Ser: 0.98 mg/dL (ref 0.61–1.24)
Glucose, Bld: 101 mg/dL — ABNORMAL HIGH (ref 65–99)
Potassium: 5.2 mmol/L — ABNORMAL HIGH (ref 3.5–5.1)
SODIUM: 138 mmol/L (ref 135–145)
TOTAL PROTEIN: 7.8 g/dL (ref 6.5–8.1)
Total Bilirubin: 0.5 mg/dL (ref 0.3–1.2)

## 2015-06-06 LAB — PSA: PSA: 41.44 ng/mL — AB (ref 0.00–4.00)

## 2015-06-06 MED ORDER — DEGARELIX ACETATE 80 MG ~~LOC~~ SOLR
80.0000 mg | Freq: Once | SUBCUTANEOUS | Status: AC
  Administered 2015-06-06: 80 mg via SUBCUTANEOUS
  Filled 2015-06-06: qty 4

## 2015-06-06 MED ORDER — FENTANYL 50 MCG/HR TD PT72: 50.0000 ug | MEDICATED_PATCH | TRANSDERMAL | Status: DC

## 2015-06-06 MED ORDER — DENOSUMAB 120 MG/1.7ML ~~LOC~~ SOLN
120.0000 mg | Freq: Once | SUBCUTANEOUS | Status: AC
  Administered 2015-06-06: 120 mg via SUBCUTANEOUS
  Filled 2015-06-06: qty 1.7

## 2015-06-06 MED ORDER — OXYCODONE HCL 10 MG PO TABS: 10.0000 mg | ORAL_TABLET | Freq: Four times a day (QID) | ORAL | Status: DC | PRN

## 2015-06-06 NOTE — Progress Notes (Signed)
[facility] [phone] Protocol Research Encounter Note:  The patient arrives in clinic today for Cycle 4 of Enzalutamide/Zytiga/Prednisone therapy per protocol.  His caregiver, Rolan Lipa, is in attendance.  He reports that he is feeling well overall and denies pain.  He remains on his Duragesic patch which appears to be managing his pain well.  He reports some occasional mild headaches that resolve when he eats some food.  Because of the dosing of his drugs he does have to go without food at certain times and that's when he notices the headache.  He denies fatigue, diarrhea, constipation, vomiting, dyspepsia, edema of ankles, arthralgias, bone pain, myalgias, insomnia, hot flashes, hypertension, cough, or dyspnea.   His performance status =1.  His appetite is improved and his weight has gone from 145 lbs on 5/2 to 155 pounds today. His compliance with his medication is very good and his caretaker has a system in place that is working for them. His potassium and glucose are only slightly elevated today and these are considered not clinically significant.  His glucose is a non-fasting value. He was seen by Dr. Oliva Bustard and approved to continue with protocol therapy.  He requested refills on his prescriptions. His Enzalutamide and Zytiga were given to him by [facility] and I called in his prednisone to his preferred pharmacy-[facility] in [place]. He has a bone scan scheduled for 8/22 and a  CT of chest, abdomen, and pelvis scheduled for 07/02/2015.  He returns to clinic on 8/25 for CBC, Met C, PSA, central labs, see MD, and restock his Enzalutamide.   Jake Samples, RN

## 2015-06-07 ENCOUNTER — Encounter: Payer: Self-pay | Admitting: Oncology

## 2015-06-08 ENCOUNTER — Encounter: Payer: Self-pay | Admitting: Oncology

## 2015-06-08 NOTE — Progress Notes (Signed)
[place] @ [facility] Telephone:([phone]) [phone]  Fax:(336) [place]: April 29, 1941  MR#: [id number]  IHW#:[id number]  Patient Care Team: Pcp Not In System as PCP - General  CHIEF COMPLAINT:  Chief Complaint  Patient presents with  . Follow-up    prostate cancer    Oncology History   1.MRI scan on August 4 of 2015  revealed in your able liver lesion and multiple areas  of   abnormallity  in the bone suggestive off metastases  2.High PSA. 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 Abdominal pain has improved.  Liver panel and enzymes have been reviewed.  No nausea no vomiting no bony pains. Patient is on [facility] protocol Performance status remains stable  REVIEW OF SYSTEMS:   ROS GENERAL:  Feels good.  Active.  No fevers, sweats or weight loss. PERFORMANCE STATUS (ECOG):  01 HEENT:  No visual changes, runny nose, sore throat, mouth sores or tenderness. Lungs: No shortness of breath or cough.  No hemoptysis. Cardiac:  No chest pain, palpitations, orthopnea, or PND. GI:  No nausea, vomiting, diarrhea, constipation, melena or hematochezia. GU:  No urgency, frequency, dysuria, or hematuria. Musculoskeletal:  No back pain.  No joint pain.  No muscle  tenderness. Extremities:  No pain or swelling. Skin:  No rashes or skin changes. Neuro:  No headache, numbness or weakness, balance or coordination issues. Endocrine:  No diabetes, thyroid issues, hot flashes or night sweats. Psych:  No mood changes, depression or anxiety. Pain:  No focal pain. Review of systems:  All other systems reviewed and found to be negative. As per HPI. Otherwise, a complete review of systems is negatve.  PAST MEDICAL HISTORY: Past Medical History  Diagnosis Date  . Hyperlipidemia   . Prostate cancer     2014  . Bone cancer     PAST SURGICAL HISTORY: Past Surgical History  Procedure Laterality Date  . Ankle surgery Right     FAMILY HISTORY There is no significant family history of breast cancer, ovarian cancer, colon cancer    HEALTH MAINTENANCE: History  Substance Use Topics  . Smoking status: Former Smoker -- 0.50 packs/day for 45 years    Types: Cigarettes    Quit date: 11/13/1991  . Smokeless tobacco: Not on file  . Alcohol Use: 29.4 oz/week    6 Cans of beer, 43 Shots of liquor per week      Allergies  Allergen Reactions  . No Known Allergies      OBJECTIVE: Filed Vitals:   06/06/15 1552  BP: 176/79  Pulse: 68  Temp: 97 F (36.1 C)     Body mass index is 22.3 kg/(m^2).    ECOG FS:1 - Symptomatic but completely ambulatory  Physical Exam  GENERAL:  Well  developed, well nourished, sitting comfortably in the exam room in no acute distress. MENTAL STATUS:  Alert and oriented to person, place and time.EYES:  No jaundice.  Pupils equal round and reactive to light and accomodation.  No conjunctivitis or scleral icterus. ENT:  Oropharynx clear without lesion.  Tongue normal. Mucous membranes moist.  RESPIRATORY:  Clear to auscultation without rales, wheezes or rhonchi. CARDIOVASCULAR:  Regular rate and rhythm without murmur, rub or gallop. BREAST:  Right breast without masses, skin changes or nipple discharge.  Left breast without  masses, skin changes or nipple discharge. ABDOMEN:  Soft, non-tender, with active bowel sounds, and no hepatosplenomegaly.  No masses. Palpable liver is decreased in size BACK:  No CVA tenderness.  No tenderness on percussion of the back or rib cage. SKIN:  No rashes, ulcers or lesions. EXTREMITIES: No edema, no skin discoloration or tenderness.  No palpable cords. LYMPH NODES: No palpable cervical, supraclavicular, axillary or inguinal adenopathy  NEUROLOGICAL: Unremarkable. PSYCH:  Appropriate. LAB RESULTS: Component     Latest Ref Rng 09/18/2014 10/16/2014 11/20/2014 02/14/2015 03/13/2015  PSA     0.00 - 4.00 ng/mL 78.4 (H) 96.5 (H) 87.6 (H) 229.0 (H) 311.00 (H)   Component     Latest Ref Rng 04/11/2015 05/09/2015 06/06/2015  PSA     0.00 - 4.00 ng/mL 44.81 (H) 42.96 (H) 41.44 (H)      Component Value Date/Time   NA 138 06/06/2015 1515   NA 129* 02/22/2015 0932   K 5.2* 06/06/2015 1515   K 4.4 02/22/2015 0932   CL 104 06/06/2015 1515   CL 96* 02/22/2015 0932   CO2 30 06/06/2015 1515   CO2 26 02/22/2015 0932   GLUCOSE 101* 06/06/2015 1515   GLUCOSE 111* 02/22/2015 0932   BUN 18 06/06/2015 1515   BUN 25* 02/22/2015 0932   CREATININE 0.98 06/06/2015 1515   CREATININE 0.96 02/22/2015 0932   CALCIUM 9.3 06/06/2015 1515   CALCIUM 7.7* 02/22/2015 0932   PROT 7.8 06/06/2015 1515   PROT 8.0 02/22/2015 0932   ALBUMIN 4.0 06/06/2015 1515   ALBUMIN 3.3* 02/22/2015 0932   AST 17 06/06/2015 1515   AST 65* 02/22/2015 0932   ALT 11* 06/06/2015 1515   ALT 63 02/22/2015 0932   ALKPHOS 100 06/06/2015 1515   ALKPHOS 256* 02/22/2015 0932   BILITOT 0.5 06/06/2015 1515   BILITOT 1.0 02/22/2015 0932   GFRNONAA >60 06/06/2015 1515   GFRNONAA >60 02/22/2015 0932   GFRAA >60 06/06/2015 1515   GFRAA >60 02/22/2015 0932    No results found for: SPEP, UPEP  Lab Results  Component Value Date   WBC 9.1 06/06/2015   NEUTROABS 4.7 06/06/2015   HGB 12.7* 06/06/2015   HCT 39.3* 06/06/2015   MCV  88.6 06/06/2015   PLT 247 06/06/2015      Chemistry      Component Value Date/Time   NA 138 06/06/2015 1515   NA 129* 02/22/2015 0932   K 5.2* 06/06/2015 1515   K 4.4 02/22/2015 0932   CL 104 06/06/2015 1515   CL 96* 02/22/2015 0932   CO2 30 06/06/2015 1515   CO2 26 02/22/2015 0932   BUN 18 06/06/2015 1515   BUN 25* 02/22/2015 0932   CREATININE 0.98 06/06/2015 1515   CREATININE 0.96 02/22/2015 0932      Component Value Date/Time   CALCIUM 9.3 06/06/2015 1515   CALCIUM 7.7* 02/22/2015 0932   ALKPHOS 100 06/06/2015 1515   ALKPHOS 256* 02/22/2015 0932  AST 17 06/06/2015 1515   AST 65* 02/22/2015 0932   ALT 11* 06/06/2015 1515   ALT 63 02/22/2015 0932   BILITOT 0.5 06/06/2015 1515   BILITOT 1.0 02/22/2015 0932      Lab Results  Component Value Date   PSA 41.44* 06/06/2015     STUDIES: No results found.  ASSESSMENT:  Stage IV carcinoma prostate.  Castration resistant prostate cancer . Patient is on protocol.  Tolerating treatment very well. I will lab data has been reviewed. His general condition is improved.  Patient's PSA has been declining Liver enzymes have been improving Ct  scan has been reviewed and shows significant response measurement reveals partial response.  Will continue XGEVA and FIRMAGON  Continue protocol medicine in [place] The scan has been reviewed. Discussed with our protocol nurse about planning next treatment as well as next investigation Patient expressed understanding and was in agreement with this plan. He also understands that He can call clinic at any time with any questions, concerns, or complaints.    Prostate cancer metastatic to multiple sites   Staging form: Prostate, AJCC 7th Edition     Clinical: Stage IV (T3, N0, M1b, PSA: 20 or greater, Gleason 8-10 - Poorly differentiated/undifferentiated (marked anaplasia)) - Signed by Forest Gleason, MD on 03/17/2015   Forest Gleason, MD   06/08/2015 7:47 AM           Smoking History: Smoking History 1(1)Packs per day and Quit 1993 after 30 yr history.(1)                         )     Advance Directive:  Advance Directive ([place]) yes(1)   Do you want to revise or change your advance directive? No(1)

## 2015-07-01 ENCOUNTER — Ambulatory Visit: Payer: Medicare Other

## 2015-07-02 ENCOUNTER — Ambulatory Visit
Admission: RE | Admit: 2015-07-02 | Discharge: 2015-07-02 | Disposition: A | Discharge status: Home / Self Care | Payer: Medicare Other | Source: Ambulatory Visit | Attending: Oncology | Admitting: Oncology

## 2015-07-02 DIAGNOSIS — C7951 Secondary malignant neoplasm of bone: Secondary | ICD-10-CM | POA: Diagnosis not present

## 2015-07-02 DIAGNOSIS — K8020 Calculus of gallbladder without cholecystitis without obstruction: Secondary | ICD-10-CM | POA: Diagnosis not present

## 2015-07-02 DIAGNOSIS — R918 Other nonspecific abnormal finding of lung field: Secondary | ICD-10-CM | POA: Diagnosis not present

## 2015-07-02 DIAGNOSIS — C61 Malignant neoplasm of prostate: Secondary | ICD-10-CM | POA: Diagnosis present

## 2015-07-02 DIAGNOSIS — C787 Secondary malignant neoplasm of liver and intrahepatic bile duct: Secondary | ICD-10-CM | POA: Insufficient documentation

## 2015-07-02 DIAGNOSIS — I2510 Atherosclerotic heart disease of native coronary artery without angina pectoris: Secondary | ICD-10-CM | POA: Insufficient documentation

## 2015-07-02 MED ORDER — IOHEXOL 300 MG/ML  SOLN
100.0000 mL | Freq: Once | INTRAMUSCULAR | Status: AC | PRN
Start: 2015-07-02 — End: 2015-07-02
  Administered 2015-07-02: 100 mL via INTRAVENOUS

## 2015-07-03 ENCOUNTER — Other Ambulatory Visit: Payer: Self-pay | Admitting: *Deleted

## 2015-07-03 DIAGNOSIS — C61 Malignant neoplasm of prostate: Secondary | ICD-10-CM

## 2015-07-03 MED ORDER — INV-ENZALUTAMIDE 40 MG CAPS #120 ALLIANCE A031201
160.0000 mg | ORAL_CAPSULE | Freq: Every day | ORAL | Status: DC
Start: 2015-07-03 — End: 2015-08-29

## 2015-07-03 MED ORDER — PREDNISONE 5 MG PO TABS
5.0000 mg | ORAL_TABLET | Freq: Two times a day (BID) | ORAL | Status: DC
Start: 2015-07-03 — End: 2015-08-29

## 2015-07-03 MED ORDER — ABIRATERONE ACETATE 250 MG PO TABS: 1000.0000 mg | ORAL_TABLET | Freq: Every day | ORAL | Status: DC

## 2015-07-04 ENCOUNTER — Ambulatory Visit: Payer: Medicare Other | Admitting: Oncology

## 2015-07-04 ENCOUNTER — Other Ambulatory Visit: Payer: Medicare Other

## 2015-07-04 ENCOUNTER — Encounter: Payer: Self-pay | Admitting: Oncology

## 2015-07-04 ENCOUNTER — Other Ambulatory Visit: Payer: Self-pay | Admitting: *Deleted

## 2015-07-04 ENCOUNTER — Inpatient Hospital Stay: Payer: Medicare Other

## 2015-07-04 ENCOUNTER — Inpatient Hospital Stay: Payer: Medicare Other | Attending: Oncology

## 2015-07-04 ENCOUNTER — Encounter: Admission: RE | Admit: 2015-07-04 | Payer: Medicare Other | Source: Ambulatory Visit

## 2015-07-04 ENCOUNTER — Inpatient Hospital Stay (HOSPITAL_BASED_OUTPATIENT_CLINIC_OR_DEPARTMENT_OTHER): Payer: Medicare Other | Admitting: Oncology

## 2015-07-04 ENCOUNTER — Encounter: Payer: Self-pay | Admitting: *Deleted

## 2015-07-04 ENCOUNTER — Encounter
Admission: RE | Admit: 2015-07-04 | Discharge: 2015-07-04 | Disposition: A | Discharge status: Home / Self Care | Payer: Medicare Other | Source: Ambulatory Visit | Attending: Oncology | Admitting: Oncology

## 2015-07-04 VITALS — BP 151/73 | HR 55 | Temp 96.3°F | Wt 158.9 lb

## 2015-07-04 DIAGNOSIS — Z79818 Long term (current) use of other agents affecting estrogen receptors and estrogen levels: Secondary | ICD-10-CM | POA: Insufficient documentation

## 2015-07-04 DIAGNOSIS — C61 Malignant neoplasm of prostate: Secondary | ICD-10-CM | POA: Insufficient documentation

## 2015-07-04 DIAGNOSIS — Z79899 Other long term (current) drug therapy: Secondary | ICD-10-CM | POA: Insufficient documentation

## 2015-07-04 DIAGNOSIS — Z87891 Personal history of nicotine dependence: Secondary | ICD-10-CM | POA: Insufficient documentation

## 2015-07-04 DIAGNOSIS — C787 Secondary malignant neoplasm of liver and intrahepatic bile duct: Secondary | ICD-10-CM | POA: Insufficient documentation

## 2015-07-04 DIAGNOSIS — E785 Hyperlipidemia, unspecified: Secondary | ICD-10-CM | POA: Insufficient documentation

## 2015-07-04 DIAGNOSIS — C7951 Secondary malignant neoplasm of bone: Secondary | ICD-10-CM | POA: Diagnosis not present

## 2015-07-04 LAB — CBC WITH DIFFERENTIAL/PLATELET
Basophils Absolute: 0 10*3/uL (ref 0–0.1)
Basophils Relative: 1 %
Eosinophils Absolute: 0.1 10*3/uL (ref 0–0.7)
Eosinophils Relative: 2 %
HEMATOCRIT: 35.9 % — AB (ref 40.0–52.0)
HEMOGLOBIN: 11.8 g/dL — AB (ref 13.0–18.0)
LYMPHS ABS: 2.3 10*3/uL (ref 1.0–3.6)
Lymphocytes Relative: 34 %
MCH: 28.8 pg (ref 26.0–34.0)
MCHC: 32.8 g/dL (ref 32.0–36.0)
MCV: 87.9 fL (ref 80.0–100.0)
MONOS PCT: 9 %
Monocytes Absolute: 0.6 10*3/uL (ref 0.2–1.0)
NEUTROS ABS: 3.8 10*3/uL (ref 1.4–6.5)
NEUTROS PCT: 54 %
Platelets: 227 10*3/uL (ref 150–440)
RBC: 4.09 MIL/uL — ABNORMAL LOW (ref 4.40–5.90)
RDW: 15.9 % — ABNORMAL HIGH (ref 11.5–14.5)
WBC: 6.9 10*3/uL (ref 3.8–10.6)

## 2015-07-04 LAB — COMPREHENSIVE METABOLIC PANEL
ALBUMIN: 3.8 g/dL (ref 3.5–5.0)
ALK PHOS: 91 U/L (ref 38–126)
ALT: 12 U/L — AB (ref 17–63)
ANION GAP: 5 (ref 5–15)
AST: 19 U/L (ref 15–41)
BILIRUBIN TOTAL: 0.4 mg/dL (ref 0.3–1.2)
BUN: 25 mg/dL — ABNORMAL HIGH (ref 6–20)
CALCIUM: 8.6 mg/dL — AB (ref 8.9–10.3)
CO2: 28 mmol/L (ref 22–32)
CREATININE: 0.94 mg/dL (ref 0.61–1.24)
Chloride: 103 mmol/L (ref 101–111)
GFR calc non Af Amer: >60 mL/min (ref >60)
GLUCOSE: 107 mg/dL — AB (ref 65–99)
Potassium: 4.5 mmol/L (ref 3.5–5.1)
Sodium: 136 mmol/L (ref 135–145)
TOTAL PROTEIN: 7.5 g/dL (ref 6.5–8.1)

## 2015-07-04 LAB — PSA: PSA: 32.59 ng/mL — ABNORMAL HIGH (ref 0.00–4.00)

## 2015-07-04 MED ORDER — TECHNETIUM TC 99M MEDRONATE IV KIT
24.0220 | PACK | Freq: Once | INTRAVENOUS | Status: AC | PRN
  Administered 2015-07-04: 24.022 via INTRAVENOUS

## 2015-07-04 MED ORDER — INV-ENZALUTAMIDE 40 MG CAPS #120 ALLIANCE A031201: 160.0000 mg | ORAL_CAPSULE | Freq: Every day | ORAL | Status: DC

## 2015-07-04 MED ORDER — FENTANYL 50 MCG/HR TD PT72: 50.0000 ug | MEDICATED_PATCH | TRANSDERMAL | Status: DC

## 2015-07-04 MED ORDER — ABIRATERONE ACETATE 250 MG PO TABS: 1000.0000 mg | ORAL_TABLET | Freq: Every day | ORAL | Status: DC

## 2015-07-04 MED ORDER — PREDNISONE 5 MG PO TABS: 5.0000 mg | ORAL_TABLET | Freq: Two times a day (BID) | ORAL | Status: DC

## 2015-07-04 MED ORDER — DENOSUMAB 120 MG/1.7ML ~~LOC~~ SOLN
120.0000 mg | Freq: Once | SUBCUTANEOUS | Status: AC
  Administered 2015-07-04: 120 mg via SUBCUTANEOUS
  Filled 2015-07-04: qty 1.7

## 2015-07-04 MED ORDER — DEGARELIX ACETATE 80 MG ~~LOC~~ SOLR
80.0000 mg | Freq: Once | SUBCUTANEOUS | Status: AC
  Administered 2015-07-04: 80 mg via SUBCUTANEOUS
  Filled 2015-07-04: qty 4

## 2015-07-04 MED ORDER — DEGARELIX ACETATE 80 MG ~~LOC~~ SOLR: 80.0000 mg | Freq: Once | SUBCUTANEOUS | Status: DC

## 2015-07-04 NOTE — Progress Notes (Signed)
[facility] [phone] Protocol Research Encounter Note:  The patient arrives in clinic today for Cycle 5 of Enzalutamide/Prednisone therapy per protocol.  His caregiver, Sean Wolfe, is in attendance today.  Sean Wolfe was suppose to have had a bone scan on Monday but he never received his clinic card notifying him of the appointment for bone scan or regarding today's follow up appointment.  I discovered the problem yesterday and talked to [facility].  Sean Wolfe and Sean Wolfe had planned on leaving for a vacation over the weekend but were willing to wait one day.  The bone scan was rescheduled and done this morning.  He reports that he is doing well and that his pain is well-controlled with Duragesic patch.  He grades his pain as  3/10.  His review of systems is negative with the exception of some occasional hot flashes ( grade 1).  His weight is up to 72.1 kg from 70.5 kg.  His BUN and glucose are slightly elevated but his glucose is a non-fasting value.  His calcium and hemoglobin are slightly decreased.  His liver functions studies are wnl.  The patient's PSA is pending.  Dr. Oliva Bustard deems the slightly abnormal values as not clinically significant.  The patient was seen and examined by Dr. Oliva Bustard and the patient was cleared to continue treatment.  The patient's study calendars and medications records were collected and reviewed, the pill count was performed, and new calendars were issued.   Sean Samples, RN

## 2015-07-04 NOTE — Progress Notes (Signed)
Patient does not have living will.  Former smoker. 

## 2015-07-05 ENCOUNTER — Encounter: Payer: Self-pay | Admitting: Oncology

## 2015-07-05 NOTE — Progress Notes (Signed)
[place] @ [facility] [phone]) [phone]  Fax:(336) [place]: 1941/07/30  MR#: [id number]  IRS#:[id number]  Patient Care Team: Pcp Not In System as PCP - General  CHIEF COMPLAINT:  Chief Complaint  Patient presents with  . Follow-up    Oncology History   1.MRI scan on August 4 of 2015  revealed in your able liver lesion and multiple areas  of   abnormallity  in the bone suggestive off metastases  2.High PSA. 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  Patient has been started on [facility] (August, 2015) and Delton See, 4.patient started on Alliance protocol with Gillermina Phy nd ZYTIGA (May, 2016)     Prostate cancer metastatic to multiple sites   03/11/2015 Initial Diagnosis Prostate cancer metastatic to multiple sites    Oncology Flowsheet 03/13/2015 04/11/2015 05/09/2015 06/06/2015 07/04/2015  degarelix (FIRMAGON) 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 Abdominal pain has improved.  Liver panel and enzymes have been reviewed.  No nausea no vomiting no bony pains. Patient is on Alliance protocol Performance status remains stable August 25 th 2016 The patient came today further follow-up.  No abdominal pain or discomfort.  No nausea.  No vomiting.  Patient had a bone scan and a CT scan of abdomen which has been reviewed independently and documented in impression.  Overall patient is responding to present approach  REVIEW OF SYSTEMS:   ROS GENERAL:  Feels good.  Active.  No fevers, sweats or weight loss. PERFORMANCE STATUS (ECOG):  01 HEENT:  No visual changes, runny nose, sore throat, mouth sores or tenderness. Lungs: No shortness of  breath or cough.  No hemoptysis. Cardiac:  No chest pain, palpitations, orthopnea, or PND. GI:  No nausea, vomiting, diarrhea, constipation, melena or hematochezia. GU:  No urgency, frequency, dysuria, or hematuria. Musculoskeletal:  No back pain.  No joint pain.  No muscle tenderness. Extremities:  No pain or swelling. Skin:  No rashes or skin changes. Neuro:  No headache, numbness or weakness, balance or coordination issues. Endocrine:  No diabetes, thyroid issues, hot flashes or night sweats. Psych:  No mood changes, depression or anxiety. Pain:  No focal pain. Review of systems:  All other systems reviewed and found to be negative. As per HPI. Otherwise, a complete review of systems is negatve.  PAST MEDICAL HISTORY: Past Medical History  Diagnosis Date  . Hyperlipidemia   . Prostate cancer     2014  . Bone cancer     PAST SURGICAL HISTORY: Past Surgical History  Procedure Laterality Date  . Ankle surgery Right     FAMILY HISTORY There is no significant family history of breast cancer, ovarian cancer, colon cancer    HEALTH MAINTENANCE: Social History  Substance Use Topics  . Smoking status: Former Smoker -- 0.50 packs/day for 45 years    Types: Cigarettes    Quit date: 11/13/1991  . Smokeless tobacco: None  . Alcohol Use: 29.4 oz/week    6 Cans of beer, 43 Shots of liquor per week      Allergies  Allergen Reactions  . No Known Allergies  OBJECTIVE: Filed Vitals:   07/04/15 1525  BP: 151/73  Pulse: 55  Temp: 96.3 F (35.7 C)     Body mass index is 22.81 kg/(m^2).    ECOG FS:1 - Symptomatic but completely ambulatory  Physical Exam  GENERAL:  Well developed, well nourished, sitting comfortably in the exam room in no acute distress. MENTAL STATUS:  Alert and oriented to person, place and time.EYES:  No jaundice.  Pupils equal round and reactive to light and accomodation.  No conjunctivitis or scleral icterus. ENT:  Oropharynx clear without  lesion.  Tongue normal. Mucous membranes moist.  RESPIRATORY:  Clear to auscultation without rales, wheezes or rhonchi. CARDIOVASCULAR:  Regular rate and rhythm without murmur, rub or gallop. BREAST:  Right breast without masses, skin changes or nipple discharge.  Left breast without masses, skin changes or nipple discharge. ABDOMEN:  Soft, non-tender, with active bowel sounds, and no hepatosplenomegaly.  No masses. Palpable liver is decreased in size BACK:  No CVA tenderness.  No tenderness on percussion of the back or rib cage. SKIN:  No rashes, ulcers or lesions. EXTREMITIES: No edema, no skin discoloration or tenderness.  No palpable cords. LYMPH NODES: No palpable cervical, supraclavicular, axillary or inguinal adenopathy  NEUROLOGICAL: Unremarkable. PSYCH:  Appropriate. LAB RESULTS: Component     Latest Ref Rng 02/14/2015 03/13/2015 04/11/2015 05/09/2015 06/06/2015  PSA     0.00 - 4.00 ng/mL 229.0 (H) 311.00 (H) 44.81 (H) 42.96 (H) 41.44 (H)   Component     Latest Ref Rng 07/04/2015  PSA     0.00 - 4.00 ng/mL 32.59 (H)       Component Value Date/Time   NA 136 07/04/2015 1419   NA 129* 02/22/2015 0932   K 4.5 07/04/2015 1419   K 4.4 02/22/2015 0932   CL 103 07/04/2015 1419   CL 96* 02/22/2015 0932   CO2 28 07/04/2015 1419   CO2 26 02/22/2015 0932   GLUCOSE 107* 07/04/2015 1419   GLUCOSE 111* 02/22/2015 0932   BUN 25* 07/04/2015 1419   BUN 25* 02/22/2015 0932   CREATININE 0.94 07/04/2015 1419   CREATININE 0.96 02/22/2015 0932   CALCIUM 8.6* 07/04/2015 1419   CALCIUM 7.7* 02/22/2015 0932   PROT 7.5 07/04/2015 1419   PROT 8.0 02/22/2015 0932   ALBUMIN 3.8 07/04/2015 1419   ALBUMIN 3.3* 02/22/2015 0932   AST 19 07/04/2015 1419   AST 65* 02/22/2015 0932   ALT 12* 07/04/2015 1419   ALT 63 02/22/2015 0932   ALKPHOS 91 07/04/2015 1419   ALKPHOS 256* 02/22/2015 0932   BILITOT 0.4 07/04/2015 1419   BILITOT 1.0 02/22/2015 0932   GFRNONAA >60 07/04/2015 1419   GFRNONAA >60  02/22/2015 0932   GFRNONAA >60 11/20/2014 0940   GFRAA >60 07/04/2015 1419   GFRAA >60 02/22/2015 0932   GFRAA >60 11/20/2014 0940    No results found for: SPEP, UPEP  Lab Results  Component Value Date   WBC 6.9 07/04/2015   NEUTROABS 3.8 07/04/2015   HGB 11.8* 07/04/2015   HCT 35.9* 07/04/2015   MCV 87.9 07/04/2015   PLT 227 07/04/2015      Chemistry      Component Value Date/Time   NA 136 07/04/2015 1419   NA 129* 02/22/2015 0932   K 4.5 07/04/2015 1419   K 4.4 02/22/2015 0932   CL 103 07/04/2015 1419   CL 96* 02/22/2015 0932   CO2 28 07/04/2015 1419   CO2 26 02/22/2015 0932   BUN 25*  07/04/2015 1419   BUN 25* 02/22/2015 0932   CREATININE 0.94 07/04/2015 1419   CREATININE 0.96 02/22/2015 0932      Component Value Date/Time   CALCIUM 8.6* 07/04/2015 1419   CALCIUM 7.7* 02/22/2015 0932   ALKPHOS 91 07/04/2015 1419   ALKPHOS 256* 02/22/2015 0932   AST 19 07/04/2015 1419   AST 65* 02/22/2015 0932   ALT 12* 07/04/2015 1419   ALT 63 02/22/2015 0932   BILITOT 0.4 07/04/2015 1419   BILITOT 1.0 02/22/2015 0932      Lab Results  Component Value Date   PSA 32.59* 07/04/2015     STUDIES: Ct Chest W Contrast  07/02/2015   CLINICAL DATA:  Prostate cancer with metastasis to multiple sites.  EXAM: CT CHEST, ABDOMEN, AND PELVIS WITH CONTRAST  TECHNIQUE: Multidetector CT imaging of the chest, abdomen and pelvis was performed following the standard protocol during bolus administration of intravenous contrast.  CONTRAST:  141mL OMNIPAQUE IOHEXOL 300 MG/ML  SOLN  COMPARISON:  05/06/2015  FINDINGS: RECIST 1.1  Target Lesions:  1. Right hepatic lobe pericholecystic lesion measures 1.3 cm on image 68 versus 1.6 cm on the prior. 2. Index lesion in the left hepatic lobe, just anterior to the gallbladder measures 1.6 cm on image 62 versus 1.7 cm on the prior. Non-target Lesions:  1. 5 mm left lower lobe pulmonary nodule on image 41 is similar. 2. The previously described 5 mm left  lower lobe pulmonary nodule on image 33 of the prior exam is resolved. 3. 11 mm gastrohepatic ligament node is unchanged on image 58. 4. 6 mm left pelvic sidewall node on image 97 is unchanged. CT CHEST FINDINGS  Mediastinum/Nodes: No supraclavicular adenopathy. No axillary adenopathy. Tortuous descending thoracic aorta with atherosclerosis within. Mild cardiomegaly. Multivessel coronary artery atherosclerosis. No central pulmonary embolism, on this non-dedicated study. No middle mediastinal or hilar adenopathy. Fluid level in the thoracic esophagus, including on image 29.  Low-density in the left retrocrural space in measures 12 mm on image 51 is unchanged ; favor an enlarged thoracic duct.  Lungs/Pleura: No pleural fluid.  Moderate centrilobular emphysema.  Minimal motion degradation. 2 mm left lower lobe pulmonary nodule on image 43 is unchanged.  5 mm left lower lobe pulmonary nodule on image 41 is similar.  4 mm left lower lobe pulmonary nodule on image 32 is not significantly changed.  No lobar consolidation. No new pulmonary nodules are identified. Linear density at the right lung base described on the prior exam is not readily apparent today.  Musculoskeletal: Similar widespread osseous metastasis. Compression deformity involving the T5 vertebral body is moderate and similar.  CT ABDOMEN PELVIS FINDINGS  Hepatobiliary: Multiple hepatic metastasis again identified. Index lesion in the left hepatic lobe, just anterior to the gallbladder measures 1.6 cm on image 62 versus 1.7 cm on the prior.  Right hepatic lobe pericholecystic lesion measures 1.3 cm on image 68 versus 1.6 cm on the prior. Cholelithiasis. No acute inflammation or biliary duct dilatation.  Pancreas: Mild pancreatic atrophy. The pancreatic duct is upper normal within the head, including on image 59. This is similar and without obstructive cause. No acute inflammation.  Spleen: Normal  Adrenals/Urinary Tract: Normal adrenal glands. Mild renal  cortical thinning bilaterally. An upper pole left renal cyst. Bilateral too small to characterize renal lesions. Normal ureters and urinary bladder.  Stomach/Bowel: Proximal gastric underdistention. Normal colon, appendix, and terminal ileum. Normal small bowel.  Vascular/Lymphatic: Advanced aortic and branch vessel atherosclerosis. Low left periaortic node  measures 9 mm on image 75 and is similar. Not pathologic by size criteria.  An upper normal size gastrohepatic ligament node measures 11 mm on image 58 is unchanged.  Reproductive: Atrophic prostate.  Other: No significant free fluid. Injection sites within the anterior abdominal wall.  Musculoskeletal: Widespread sclerotic osseous metastasis. Similar. Similar superior endplate compression deformity at L1.  IMPRESSION: CT CHEST IMPRESSION  1. Similar to mild improvement in nonspecific pulmonary nodules. 2. Similar widespread sclerotic osseous metastasis. 3. No evidence of thoracic adenopathy. 4.  Atherosclerosis, including within the coronary arteries. 5. Esophageal air fluid level suggests dysmotility or gastroesophageal reflux.  CT ABDOMEN AND PELVIS IMPRESSION  1. Interval mild improvement in hepatic metastasis. 2. Similar osseous metastasis. 3. Cholelithiasis.   Electronically Signed   By: Abigail Miyamoto M.D.   On: 07/02/2015 14:07   Nm Bone Scan Whole Body  07/04/2015   CLINICAL DATA:  Prostate cancer.  EXAM: NUCLEAR MEDICINE WHOLE BODY BONE SCAN  TECHNIQUE: Whole body anterior and posterior images were obtained approximately 3 hours after intravenous injection of radiopharmaceutical.  RADIOPHARMACEUTICALS:  24.02 mCi Technetium-74m MDP IV  COMPARISON:  05/01/2015.  FINDINGS: Focal areas of increased activity noted lower lumbar spine, sacral region, bilateral superior pubic rami, proximal right femur consistent with metastatic disease. Similar findings noted on prior exam.  IMPRESSION: Persistent changes of metastatic disease without significant  progression.   Electronically Signed   By: Marcello Moores  Register   On: 07/04/2015 12:01   Ct Abdomen Pelvis W Contrast  07/02/2015   CLINICAL DATA:  Prostate cancer with metastasis to multiple sites.  EXAM: CT CHEST, ABDOMEN, AND PELVIS WITH CONTRAST  TECHNIQUE: Multidetector CT imaging of the chest, abdomen and pelvis was performed following the standard protocol during bolus administration of intravenous contrast.  CONTRAST:  140mL OMNIPAQUE IOHEXOL 300 MG/ML  SOLN  COMPARISON:  05/06/2015  FINDINGS: RECIST 1.1  Target Lesions:  1. Right hepatic lobe pericholecystic lesion measures 1.3 cm on image 68 versus 1.6 cm on the prior. 2. Index lesion in the left hepatic lobe, just anterior to the gallbladder measures 1.6 cm on image 62 versus 1.7 cm on the prior. Non-target Lesions:  1. 5 mm left lower lobe pulmonary nodule on image 41 is similar. 2. The previously described 5 mm left lower lobe pulmonary nodule on image 33 of the prior exam is resolved. 3. 11 mm gastrohepatic ligament node is unchanged on image 58. 4. 6 mm left pelvic sidewall node on image 97 is unchanged. CT CHEST FINDINGS  Mediastinum/Nodes: No supraclavicular adenopathy. No axillary adenopathy. Tortuous descending thoracic aorta with atherosclerosis within. Mild cardiomegaly. Multivessel coronary artery atherosclerosis. No central pulmonary embolism, on this non-dedicated study. No middle mediastinal or hilar adenopathy. Fluid level in the thoracic esophagus, including on image 29.  Low-density in the left retrocrural space in measures 12 mm on image 51 is unchanged ; favor an enlarged thoracic duct.  Lungs/Pleura: No pleural fluid.  Moderate centrilobular emphysema.  Minimal motion degradation. 2 mm left lower lobe pulmonary nodule on image 43 is unchanged.  5 mm left lower lobe pulmonary nodule on image 41 is similar.  4 mm left lower lobe pulmonary nodule on image 32 is not significantly changed.  No lobar consolidation. No new pulmonary nodules  are identified. Linear density at the right lung base described on the prior exam is not readily apparent today.  Musculoskeletal: Similar widespread osseous metastasis. Compression deformity involving the T5 vertebral body is moderate and similar.  CT ABDOMEN  PELVIS FINDINGS  Hepatobiliary: Multiple hepatic metastasis again identified. Index lesion in the left hepatic lobe, just anterior to the gallbladder measures 1.6 cm on image 62 versus 1.7 cm on the prior.  Right hepatic lobe pericholecystic lesion measures 1.3 cm on image 68 versus 1.6 cm on the prior. Cholelithiasis. No acute inflammation or biliary duct dilatation.  Pancreas: Mild pancreatic atrophy. The pancreatic duct is upper normal within the head, including on image 59. This is similar and without obstructive cause. No acute inflammation.  Spleen: Normal  Adrenals/Urinary Tract: Normal adrenal glands. Mild renal cortical thinning bilaterally. An upper pole left renal cyst. Bilateral too small to characterize renal lesions. Normal ureters and urinary bladder.  Stomach/Bowel: Proximal gastric underdistention. Normal colon, appendix, and terminal ileum. Normal small bowel.  Vascular/Lymphatic: Advanced aortic and branch vessel atherosclerosis. Low left periaortic node measures 9 mm on image 75 and is similar. Not pathologic by size criteria.  An upper normal size gastrohepatic ligament node measures 11 mm on image 58 is unchanged.  Reproductive: Atrophic prostate.  Other: No significant free fluid. Injection sites within the anterior abdominal wall.  Musculoskeletal: Widespread sclerotic osseous metastasis. Similar. Similar superior endplate compression deformity at L1.  IMPRESSION: CT CHEST IMPRESSION  1. Similar to mild improvement in nonspecific pulmonary nodules. 2. Similar widespread sclerotic osseous metastasis. 3. No evidence of thoracic adenopathy. 4.  Atherosclerosis, including within the coronary arteries. 5. Esophageal air fluid level suggests  dysmotility or gastroesophageal reflux.  CT ABDOMEN AND PELVIS IMPRESSION  1. Interval mild improvement in hepatic metastasis. 2. Similar osseous metastasis. 3. Cholelithiasis.   Electronically Signed   By: Abigail Miyamoto M.D.   On: 07/02/2015 14:07    ASSESSMENT:  Stage IV carcinoma prostate.  Castration resistant prostate cancer .  Will continue XGEVA and FIRMAGON  Continue protocol medicine in withxtandi N ZYTIGA CT scan of abdomen has been reviewed   and dependently   andpatient's was continuing response bone scan has been reviewed independently and stable disease.  t with this plan. He also understands that He can call clinic at any time with any questions, concerns, or complaints.    Prostate cancer metastatic to multiple sites   Staging form: Prostate, AJCC 7th Edition     Clinical: Stage IV (T3, N0, M1b, PSA: 20 or greater, Gleason 8-10 - Poorly differentiated/undifferentiated (marked anaplasia)) - Signed by Forest Gleason, MD on 03/17/2015   Forest Gleason, MD   07/05/2015 3:50 PM          Smoking History: Smoking History 1(1)Packs per day and Quit 1993 after 30 yr history.(1)                         )     Advance Directive:  Advance Directive ([place]) yes(1)   Do you want to revise or change your advance directive? No(1)

## 2015-07-10 ENCOUNTER — Other Ambulatory Visit: Payer: Self-pay | Admitting: *Deleted

## 2015-07-10 DIAGNOSIS — C61 Malignant neoplasm of prostate: Secondary | ICD-10-CM

## 2015-07-12 ENCOUNTER — Other Ambulatory Visit: Payer: Self-pay | Admitting: *Deleted

## 2015-08-01 ENCOUNTER — Other Ambulatory Visit: Payer: Medicare Other

## 2015-08-01 ENCOUNTER — Inpatient Hospital Stay: Payer: Medicare Other

## 2015-08-01 ENCOUNTER — Inpatient Hospital Stay (HOSPITAL_BASED_OUTPATIENT_CLINIC_OR_DEPARTMENT_OTHER): Payer: Medicare Other | Admitting: Oncology

## 2015-08-01 ENCOUNTER — Ambulatory Visit: Payer: Medicare Other | Admitting: Oncology

## 2015-08-01 ENCOUNTER — Encounter: Payer: Self-pay | Admitting: *Deleted

## 2015-08-01 ENCOUNTER — Ambulatory Visit: Payer: Medicare Other

## 2015-08-01 ENCOUNTER — Inpatient Hospital Stay: Payer: Medicare Other | Attending: Oncology

## 2015-08-01 ENCOUNTER — Encounter: Payer: Self-pay | Admitting: Oncology

## 2015-08-01 VITALS — BP 161/78 | HR 57 | Temp 97.3°F | Wt 159.0 lb

## 2015-08-01 DIAGNOSIS — Z87891 Personal history of nicotine dependence: Secondary | ICD-10-CM | POA: Diagnosis not present

## 2015-08-01 DIAGNOSIS — C7951 Secondary malignant neoplasm of bone: Secondary | ICD-10-CM

## 2015-08-01 DIAGNOSIS — Z79899 Other long term (current) drug therapy: Secondary | ICD-10-CM | POA: Insufficient documentation

## 2015-08-01 DIAGNOSIS — C61 Malignant neoplasm of prostate: Secondary | ICD-10-CM

## 2015-08-01 DIAGNOSIS — E785 Hyperlipidemia, unspecified: Secondary | ICD-10-CM | POA: Diagnosis not present

## 2015-08-01 DIAGNOSIS — Z23 Encounter for immunization: Secondary | ICD-10-CM | POA: Diagnosis not present

## 2015-08-01 LAB — PSA: PSA: 33.18 ng/mL — ABNORMAL HIGH (ref 0.00–4.00)

## 2015-08-01 LAB — CBC WITH DIFFERENTIAL/PLATELET
BASOS ABS: 0 10*3/uL (ref 0–0.1)
BASOS PCT: 0 %
EOS ABS: 0 10*3/uL (ref 0–0.7)
EOS PCT: 0 %
HCT: 39.2 % — ABNORMAL LOW (ref 40.0–52.0)
Hemoglobin: 12.8 g/dL — ABNORMAL LOW (ref 13.0–18.0)
LYMPHS PCT: 31 %
Lymphs Abs: 2.5 10*3/uL (ref 1.0–3.6)
MCH: 28.6 pg (ref 26.0–34.0)
MCHC: 32.6 g/dL (ref 32.0–36.0)
MCV: 87.9 fL (ref 80.0–100.0)
MONO ABS: 0.6 10*3/uL (ref 0.2–1.0)
Monocytes Relative: 7 %
Neutro Abs: 5 10*3/uL (ref 1.4–6.5)
Neutrophils Relative %: 62 %
PLATELETS: 235 10*3/uL (ref 150–440)
RBC: 4.47 MIL/uL (ref 4.40–5.90)
RDW: 15 % — AB (ref 11.5–14.5)
WBC: 8.1 10*3/uL (ref 3.8–10.6)

## 2015-08-01 LAB — COMPREHENSIVE METABOLIC PANEL
ALBUMIN: 3.9 g/dL (ref 3.5–5.0)
ALK PHOS: 79 U/L (ref 38–126)
ALT: 13 U/L — AB (ref 17–63)
AST: 22 U/L (ref 15–41)
Anion gap: 7 (ref 5–15)
BILIRUBIN TOTAL: 0.6 mg/dL (ref 0.3–1.2)
BUN: 16 mg/dL (ref 6–20)
CALCIUM: 8.4 mg/dL — AB (ref 8.9–10.3)
CO2: 25 mmol/L (ref 22–32)
Chloride: 104 mmol/L (ref 101–111)
Creatinine, Ser: 0.96 mg/dL (ref 0.61–1.24)
GFR calc Af Amer: >60 mL/min (ref >60)
GFR calc non Af Amer: >60 mL/min (ref >60)
GLUCOSE: 107 mg/dL — AB (ref 65–99)
Potassium: 4 mmol/L (ref 3.5–5.1)
Sodium: 136 mmol/L (ref 135–145)
TOTAL PROTEIN: 8.2 g/dL — AB (ref 6.5–8.1)

## 2015-08-01 MED ORDER — INFLUENZA VAC SPLIT QUAD 0.5 ML IM SUSY
0.5000 mL | PREFILLED_SYRINGE | Freq: Once | INTRAMUSCULAR | Status: AC
  Administered 2015-08-01: 0.5 mL via INTRAMUSCULAR
  Filled 2015-08-01: qty 0.5

## 2015-08-01 MED ORDER — DENOSUMAB 120 MG/1.7ML ~~LOC~~ SOLN
120.0000 mg | Freq: Once | SUBCUTANEOUS | Status: AC
  Administered 2015-08-01: 120 mg via SUBCUTANEOUS
  Filled 2015-08-01: qty 1.7

## 2015-08-01 MED ORDER — DEGARELIX ACETATE 80 MG ~~LOC~~ SOLR
80.0000 mg | Freq: Once | SUBCUTANEOUS | Status: AC
  Administered 2015-08-01: 80 mg via SUBCUTANEOUS
  Filled 2015-08-01: qty 4

## 2015-08-01 MED ORDER — ABIRATERONE ACETATE 250 MG PO TABS: 1000.0000 mg | ORAL_TABLET | Freq: Every day | ORAL | Status: DC

## 2015-08-01 NOTE — Progress Notes (Signed)
Patient does have living will.  Former smoker. 

## 2015-08-01 NOTE — Progress Notes (Signed)
08/01/2015 @1450  [name] returns to clinic this afternoon for consideration of Cycle 6 treatment with Enzalutamide+Abiraterone+Prednisone. He has returned his pillbottles and completed medication calendars as previously instructed. Patient's Sean Wolfe is with this today. Sean Wolfe denies having any problems with the medications or any worrisome side effects. He does report occasional hot flashes, which he experienced prior to beginning the chemotherapy, and do not interfere with his ADLs. He also states he has had an occasional headache as well as a cough with some dyspnea this cycle, but is not currently bothered by it and relates it to the changing season. He is reporting transient numbness and tingling in his feet, which he states "has been going on for years" and does not affect his ambulation at all. Reports appetite is good and his weight is unchanged since last visit. VS are stable for patient. Labs reveal grade 1 anemia, grade 1 hypocalcemia, and slightly elevated serum protein. CBC & chemistries otherwise normal. Will proceed with Cycle 6 treatment as planned. Returned old bottle of Enzalutamide to pharmacy and patient was dispensed Enzalutamide 40mg  capsules #120, and was given medication calendars for Enzalutamide, Abiraterone and Prednisone with instruction reminders on administration for all of these medications. Adverse events with grade and attribution for this cycle are as follows:    Dyspnea - grade 1; unlikely related    Headache - grade 1; unlikely related    Hot flashes - grade 1; unrelated    Cough - grade 1; unlikely    Peripheral neuropathy - grade 1; unrelated    Anemia - grade 1; unlikely    Hypocalcemia - grade 1; unrelated

## 2015-08-02 ENCOUNTER — Encounter: Payer: Self-pay | Admitting: Oncology

## 2015-08-02 NOTE — Progress Notes (Signed)
[place] @ [facility] [phone]  Fax:([phone]) [place]: February 10, 1941  MR#: [id number]  QAS#:[id number]  Patient Care Team: Pcp Not In System as PCP - General  CHIEF COMPLAINT:  Chief Complaint  Patient presents with  . OTHER    Oncology History   1.MRI scan on August 4 of 2015  revealed in your able liver lesion and multiple areas  of   abnormallity  in the bone suggestive off metastases  2.High PSA. 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  Patient is on no Alliance protocol with ZYTIGA plus minus extend the period Getting regular XGEVA and androgen deprivation therapy Patient is here for ongoing evaluation feeling better and gaining weight.  No rash.  No abdominal pain.  No nausea and vomiting. Patient is here for ongoing evaluation and treatment consideration  REVIEW OF SYSTEMS:   ROS GENERAL:  Feels good.  Active.  No fevers, sweats or weight loss. PERFORMANCE STATUS (ECOG):  01 HEENT:  No visual changes, runny nose, sore throat, mouth sores or tenderness. Lungs: No shortness of breath or cough.  No hemoptysis. Cardiac:  No chest pain, palpitations, orthopnea, or PND. GI:  No nausea, vomiting, diarrhea, constipation, melena or hematochezia. GU:  No  urgency, frequency, dysuria, or hematuria. Musculoskeletal:  No back pain.  No joint pain.  No muscle tenderness. Extremities:  No pain or swelling. Skin:  No rashes or skin changes. Neuro:  No headache, numbness or weakness, balance or coordination issues. Endocrine:  No diabetes, thyroid issues, hot flashes or night sweats. Psych:  No mood changes, depression or anxiety. Pain:  No focal pain. Review of systems:  All other systems reviewed and found to be negative. As per HPI. Otherwise, a complete review of systems is negatve.  PAST MEDICAL HISTORY: Past Medical History  Diagnosis Date  . Hyperlipidemia   . Prostate cancer     2014  . Bone cancer     PAST SURGICAL HISTORY: Past Surgical History  Procedure Laterality Date  . Ankle surgery Right     FAMILY HISTORY There is no significant family history of breast cancer, ovarian cancer, colon cancer    HEALTH MAINTENANCE: Social History  Substance Use Topics  . Smoking status: Former Smoker -- 0.50 packs/day for 45 years    Types: Cigarettes    Quit date: 11/13/1991  . Smokeless tobacco: None  . Alcohol Use: 29.4 oz/week    6 Cans of beer, 43 Shots of liquor per week      Allergies  Allergen Reactions  . No Known Allergies      OBJECTIVE: Filed Vitals:   08/01/15 1446  BP: 161/78  Pulse: 57  Temp: 97.3 F (36.3 C)     Body mass  index is 22.81 kg/(m^2).    ECOG FS:1 - Symptomatic but completely ambulatory  Physical Exam  General  status: Performance status is good.  Patient has not lost significant weight HEENT: No evidence of stomatitis. Sclera and conjunctivae :: No jaundice.   pale looking. Lungs: Air  entry equal on both sides.  No rhonchi.  No rales.  Cardiac: Heart sounds are normal.  No pericardial rub.  No murmur. Lymphatic system: Cervical, axillary, inguinal, lymph nodes not palpable GI: Abdomen is soft.  No ascites.  Liver spleen not palpable.  No tenderness.  Bowel sounds are within normal  limit Lower extremity: No edema Neurological system: Higher functions, cranial nerves intact no evidence of peripheral neuropathy. Skin: No rash.  No ecchymosis... LAB RESULTS: Component     Latest Ref Rng 02/14/2015 03/13/2015 04/11/2015 05/09/2015 06/06/2015  PSA     0.00 - 4.00 ng/mL 229.0 (H) 311.00 (H) 44.81 (H) 42.96 (H) 41.44 (H)   Component     Latest Ref Rng 07/04/2015  PSA     0.00 - 4.00 ng/mL 32.59 (H)       Component Value Date/Time   NA 136 08/01/2015 1246   NA 129* 02/22/2015 0932   K 4.0 08/01/2015 1246   K 4.4 02/22/2015 0932   CL 104 08/01/2015 1246   CL 96* 02/22/2015 0932   CO2 25 08/01/2015 1246   CO2 26 02/22/2015 0932   GLUCOSE 107* 08/01/2015 1246   GLUCOSE 111* 02/22/2015 0932   BUN 16 08/01/2015 1246   BUN 25* 02/22/2015 0932   CREATININE 0.96 08/01/2015 1246   CREATININE 0.96 02/22/2015 0932   CALCIUM 8.4* 08/01/2015 1246   CALCIUM 7.7* 02/22/2015 0932   PROT 8.2* 08/01/2015 1246   PROT 8.0 02/22/2015 0932   ALBUMIN 3.9 08/01/2015 1246   ALBUMIN 3.3* 02/22/2015 0932   AST 22 08/01/2015 1246   AST 65* 02/22/2015 0932   ALT 13* 08/01/2015 1246   ALT 63 02/22/2015 0932   ALKPHOS 79 08/01/2015 1246   ALKPHOS 256* 02/22/2015 0932   BILITOT 0.6 08/01/2015 1246   BILITOT 1.0 02/22/2015 0932   GFRNONAA >60 08/01/2015 1246   GFRNONAA >60 02/22/2015 0932   GFRNONAA >60 11/20/2014 0940   GFRAA >60 08/01/2015 1246   GFRAA >60 02/22/2015 0932   GFRAA >60 11/20/2014 0940    No results found for: SPEP, UPEP  Lab Results  Component Value Date   WBC 8.1 08/01/2015   NEUTROABS 5.0 08/01/2015   HGB 12.8* 08/01/2015   HCT 39.2* 08/01/2015   MCV 87.9 08/01/2015   PLT 235 08/01/2015      Chemistry      Component Value Date/Time   NA 136 08/01/2015 1246   NA 129* 02/22/2015 0932   K 4.0 08/01/2015 1246   K 4.4 02/22/2015 0932   CL 104 08/01/2015 1246   CL 96* 02/22/2015 0932   CO2 25 08/01/2015 1246   CO2 26 02/22/2015 0932   BUN 16 08/01/2015  1246   BUN 25* 02/22/2015 0932   CREATININE 0.96 08/01/2015 1246   CREATININE 0.96 02/22/2015 0932      Component Value Date/Time   CALCIUM 8.4* 08/01/2015 1246   CALCIUM 7.7* 02/22/2015 0932   ALKPHOS 79 08/01/2015 1246   ALKPHOS 256* 02/22/2015 0932   AST 22 08/01/2015 1246   AST 65* 02/22/2015 0932   ALT 13* 08/01/2015 1246   ALT 63 02/22/2015 0932   BILITOT 0.6 08/01/2015 1246   BILITOT 1.0 02/22/2015 0932  Lab Results  Component Value Date   PSA 33.18* 08/01/2015     STUDIES: Nm Bone Scan Whole Body  07/08/2015   ADDENDUM REPORT: 07/08/2015 14:38  COMPARISON:  BASELINE BONE SCAN 02/01/2015, BONE SCAN 05/01/2015: COMPARISON:  BASELINE BONE SCAN 02/01/2015, BONE SCAN 05/01/2015 PCWG2 Measurements for Bone Metastasis:  1. No evidence of new skeletal lesions. 2. Widespread skeletal metastasis unchanged from baseline exam of 02/01/2015.   Electronically Signed   By: Suzy Bouchard M.D.   On: 07/08/2015 14:38   07/08/2015   CLINICAL DATA:  Prostate cancer.  EXAM: NUCLEAR MEDICINE WHOLE BODY BONE SCAN  TECHNIQUE: Whole body anterior and posterior images were obtained approximately 3 hours after intravenous injection of radiopharmaceutical.  RADIOPHARMACEUTICALS:  24.02 mCi Technetium-22m MDP IV  COMPARISON:  05/01/2015.  FINDINGS: Focal areas of increased activity noted lower lumbar spine, sacral region, bilateral superior pubic rami, proximal right femur consistent with metastatic disease. Similar findings noted on prior exam.  IMPRESSION: Persistent changes of metastatic disease without significant progression.  Electronically Signed: ByMarcello Moores  Register On: 07/04/2015 12:01    ASSESSMENT:  Stage IV carcinoma prostate.  Castration resistant prostate cancer .  I discussed situation with protocol nurse.  PSA slightly elevated.  Patient remains asymptomatic.  All lab data has been reviewed.  A repeat CT scan has been ordered for reevaluation Lifestyle assessment has been  reviewed as required by protocol. Proceed with continuing protocol medicine with ZYTIGA plus minus extend the Alkaline phosphatase 256     Prostate cancer metastatic to multiple sites   Staging form: Prostate, AJCC 7th Edition     Clinical: Stage IV (T3, N0, M1b, PSA: 20 or greater, Gleason 8-10 - Poorly differentiated/undifferentiated (marked anaplasia)) - Signed by Forest Gleason, MD on 03/17/2015   Forest Gleason, MD   08/02/2015 8:19 AM          Smoking History: Smoking History 1(1)Packs per day and Quit 1993 after 30 yr history.(1)                         )     Advance Directive:  Advance Directive ([place]) yes(1)   Do you want to revise or change your advance directive? No(1)

## 2015-08-26 ENCOUNTER — Ambulatory Visit
Admission: RE | Admit: 2015-08-26 | Discharge: 2015-08-26 | Disposition: A | Discharge status: Home / Self Care | Payer: Medicare Other | Source: Ambulatory Visit | Attending: Oncology | Admitting: Oncology

## 2015-08-26 DIAGNOSIS — I2510 Atherosclerotic heart disease of native coronary artery without angina pectoris: Secondary | ICD-10-CM | POA: Diagnosis not present

## 2015-08-26 DIAGNOSIS — C61 Malignant neoplasm of prostate: Secondary | ICD-10-CM | POA: Diagnosis not present

## 2015-08-26 DIAGNOSIS — C7951 Secondary malignant neoplasm of bone: Secondary | ICD-10-CM | POA: Insufficient documentation

## 2015-08-26 DIAGNOSIS — K8020 Calculus of gallbladder without cholecystitis without obstruction: Secondary | ICD-10-CM | POA: Insufficient documentation

## 2015-08-26 DIAGNOSIS — I723 Aneurysm of iliac artery: Secondary | ICD-10-CM | POA: Diagnosis not present

## 2015-08-26 MED ORDER — IOHEXOL 300 MG/ML  SOLN
100.0000 mL | Freq: Once | INTRAMUSCULAR | Status: AC | PRN
  Administered 2015-08-26: 100 mL via INTRAVENOUS

## 2015-08-28 ENCOUNTER — Encounter: Admission: RE | Admit: 2015-08-28 | Payer: Medicare Other | Source: Ambulatory Visit

## 2015-08-28 ENCOUNTER — Encounter
Admission: RE | Admit: 2015-08-28 | Discharge: 2015-08-28 | Disposition: A | Discharge status: Home / Self Care | Payer: Medicare Other | Source: Ambulatory Visit | Attending: Oncology | Admitting: Oncology

## 2015-08-28 DIAGNOSIS — C61 Malignant neoplasm of prostate: Secondary | ICD-10-CM | POA: Insufficient documentation

## 2015-08-28 MED ORDER — TECHNETIUM TC 99M MEDRONATE IV KIT
25.0000 | PACK | Freq: Once | INTRAVENOUS | Status: AC | PRN
  Administered 2015-08-28: 23.75 via INTRAVENOUS

## 2015-08-29 ENCOUNTER — Inpatient Hospital Stay: Payer: Medicare Other | Attending: Oncology

## 2015-08-29 ENCOUNTER — Encounter: Payer: Self-pay | Admitting: Oncology

## 2015-08-29 ENCOUNTER — Inpatient Hospital Stay (HOSPITAL_BASED_OUTPATIENT_CLINIC_OR_DEPARTMENT_OTHER): Payer: Medicare Other | Admitting: Oncology

## 2015-08-29 ENCOUNTER — Inpatient Hospital Stay: Payer: Medicare Other

## 2015-08-29 ENCOUNTER — Encounter: Payer: Self-pay | Admitting: *Deleted

## 2015-08-29 VITALS — BP 154/91 | HR 63 | Temp 98.1°F | Wt 157.0 lb

## 2015-08-29 DIAGNOSIS — Z87891 Personal history of nicotine dependence: Secondary | ICD-10-CM | POA: Diagnosis not present

## 2015-08-29 DIAGNOSIS — C7951 Secondary malignant neoplasm of bone: Secondary | ICD-10-CM | POA: Diagnosis not present

## 2015-08-29 DIAGNOSIS — Z191 Hormone sensitive malignancy status: Secondary | ICD-10-CM | POA: Insufficient documentation

## 2015-08-29 DIAGNOSIS — Z79818 Long term (current) use of other agents affecting estrogen receptors and estrogen levels: Secondary | ICD-10-CM | POA: Insufficient documentation

## 2015-08-29 DIAGNOSIS — Z79899 Other long term (current) drug therapy: Secondary | ICD-10-CM | POA: Insufficient documentation

## 2015-08-29 DIAGNOSIS — Z006 Encounter for examination for normal comparison and control in clinical research program: Secondary | ICD-10-CM | POA: Diagnosis not present

## 2015-08-29 DIAGNOSIS — C61 Malignant neoplasm of prostate: Secondary | ICD-10-CM

## 2015-08-29 DIAGNOSIS — E785 Hyperlipidemia, unspecified: Secondary | ICD-10-CM | POA: Diagnosis not present

## 2015-08-29 DIAGNOSIS — R9721 Rising PSA following treatment for malignant neoplasm of prostate: Secondary | ICD-10-CM | POA: Diagnosis not present

## 2015-08-29 DIAGNOSIS — C787 Secondary malignant neoplasm of liver and intrahepatic bile duct: Secondary | ICD-10-CM | POA: Insufficient documentation

## 2015-08-29 LAB — CBC WITH DIFFERENTIAL/PLATELET
BASOS ABS: 0 10*3/uL (ref 0–0.1)
Basophils Relative: 0 %
EOS ABS: 0.1 10*3/uL (ref 0–0.7)
EOS PCT: 1 %
HCT: 39 % — ABNORMAL LOW (ref 40.0–52.0)
HEMOGLOBIN: 12.8 g/dL — AB (ref 13.0–18.0)
LYMPHS PCT: 21 %
Lymphs Abs: 2 10*3/uL (ref 1.0–3.6)
MCH: 28.4 pg (ref 26.0–34.0)
MCHC: 32.8 g/dL (ref 32.0–36.0)
MCV: 86.7 fL (ref 80.0–100.0)
Monocytes Absolute: 0.4 10*3/uL (ref 0.2–1.0)
Monocytes Relative: 4 %
NEUTROS PCT: 74 %
Neutro Abs: 7.1 10*3/uL — ABNORMAL HIGH (ref 1.4–6.5)
PLATELETS: 262 10*3/uL (ref 150–440)
RBC: 4.5 MIL/uL (ref 4.40–5.90)
RDW: 14.6 % — ABNORMAL HIGH (ref 11.5–14.5)
WBC: 9.6 10*3/uL (ref 3.8–10.6)

## 2015-08-29 LAB — COMPREHENSIVE METABOLIC PANEL
ALK PHOS: 106 U/L (ref 38–126)
ALT: 13 U/L — AB (ref 17–63)
AST: 21 U/L (ref 15–41)
Albumin: 4.1 g/dL (ref 3.5–5.0)
Anion gap: 6 (ref 5–15)
BUN: 17 mg/dL (ref 6–20)
CHLORIDE: 103 mmol/L (ref 101–111)
CO2: 28 mmol/L (ref 22–32)
CREATININE: 0.94 mg/dL (ref 0.61–1.24)
Calcium: 9.2 mg/dL (ref 8.9–10.3)
GFR calc Af Amer: >60 mL/min (ref >60)
GFR calc non Af Amer: >60 mL/min (ref >60)
Glucose, Bld: 111 mg/dL — ABNORMAL HIGH (ref 65–99)
Potassium: 4.7 mmol/L (ref 3.5–5.1)
SODIUM: 137 mmol/L (ref 135–145)
Total Bilirubin: 0.6 mg/dL (ref 0.3–1.2)
Total Protein: 8.5 g/dL — ABNORMAL HIGH (ref 6.5–8.1)

## 2015-08-29 LAB — PSA: PSA: 45.99 ng/mL — ABNORMAL HIGH (ref 0.00–4.00)

## 2015-08-29 MED ORDER — DENOSUMAB 120 MG/1.7ML ~~LOC~~ SOLN
120.0000 mg | Freq: Once | SUBCUTANEOUS | Status: AC
  Administered 2015-08-29: 120 mg via SUBCUTANEOUS
  Filled 2015-08-29: qty 1.7

## 2015-08-29 MED ORDER — OXYCODONE HCL 10 MG PO TABS: 10.0000 mg | ORAL_TABLET | Freq: Four times a day (QID) | ORAL | Status: DC | PRN

## 2015-08-29 MED ORDER — FENTANYL 50 MCG/HR TD PT72: 50.0000 ug | MEDICATED_PATCH | TRANSDERMAL | Status: DC

## 2015-08-29 MED ORDER — DEGARELIX ACETATE 80 MG ~~LOC~~ SOLR
80.0000 mg | Freq: Once | SUBCUTANEOUS | Status: AC
  Administered 2015-08-29: 80 mg via SUBCUTANEOUS
  Filled 2015-08-29: qty 4

## 2015-08-29 NOTE — Progress Notes (Signed)
08/29/15 @14 :05pm Sean Wolfe returns to clinic this afternoon for consideration of Cycle 7 treatment with Enzalutamide/Abiraterone/Prednisone on the [facility] [phone] study. Patient reports he has been doing well. He has returned his study drug bottle as well as his bottle of Abiraterone for pill count. Patient has taken his Enzalutamide as prescribed and has only 8 capsules remaining in bottle; which verifies compliance to the study drug. Per his medication calendar, Sean Wolfe has missed one dose of the Abiraterone Reviewed AE this cycle and he has 36 capsules remaining in that bottle - which he will continue to take until finished, then begin his new bottle of drug. Reviewed AE checklist with Sean Wolfe and he reports experiencing some occasional dyspepsia - reportedly related to his diet, ongoing bone pain specifically in his right lower leg, and hot flashes occurring predominantly at night. Although worrisome, these AEs are all grade 1. Dr. Oliva Bustard in to examine the patient. His weight and VS are stable. CBC and chemistries are within acceptable parameters for patient to continue on study without dose reduction. New bottle of Enzalutamide was dispensed to patient from the pharmacy. New medication diaries also given to the patient for cycle 7. AEs with grade and attributions as follows:     Hot flashes - grade 1; unrelated Dyspepsia - grade 1; unlikely    Bone pain - grade 1; unrelated Peripheral neuropathy - grade 1; unrelated Anemia - grade 1; unlikely

## 2015-08-29 NOTE — Progress Notes (Signed)
[place] @ [facility] [phone]) [phone]  Fax:(336) [place]: [birth year]  MR#: [id number]  JYN#:[id number]  Patient Care Team: Pcp Not In System as PCP - General  CHIEF COMPLAINT:  Chief Complaint  Patient presents with  . OTHER    Oncology History   1.MRI scan on August 4 of 2015  revealed in your able liver lesion and multiple areas  of   abnormallity  in the bone suggestive off metastases  2.High PSA. 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  Patient has been started on [facility] (August, 2015) and Delton See, 4.patient started on Alliance protocol with Gillermina Phy nd ZYTIGA (May, 2016)     Prostate cancer metastatic to multiple sites [facility])   03/11/2015 Initial Diagnosis Prostate cancer metastatic to multiple sites    Oncology Flowsheet 03/13/2015 04/11/2015 05/09/2015 06/06/2015 07/04/2015 08/01/2015  degarelix (FIRMAGON) [place] 80 mg 80 mg 80 mg 80 mg 80 mg 80 mg  denosumab (XGEVA) [place] 120 mg 120 mg 120 mg 120 mg 120 mg 120 mg    INTERVAL HISTORY: HPI:   74 year old gentleman with a 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  Patient is on no Alliance protocol with ZYTIGA plus minus extend the period Getting regular XGEVA and androgen deprivation therapy Patient is here for ongoing evaluation and continuation of chemotherapy.  No chills.  No fever.  Taking XTANDI and ZYTIGA under protocol tolerating well had a CT scan of abdomen as well as bone scan done.  No increase in the bony pain appetite stable gaining weight  REVIEW OF SYSTEMS:   ROS GENERAL:  Feels good.  Active.  No fevers, sweats or weight loss. PERFORMANCE STATUS (ECOG):  01 HEENT:  No visual changes, runny nose, sore throat, mouth sores or tenderness. Lungs: No shortness of breath or cough.  No hemoptysis. Cardiac:  No chest pain, palpitations, orthopnea, or PND. GI:  No  nausea, vomiting, diarrhea, constipation, melena or hematochezia. GU:  No urgency, frequency, dysuria, or hematuria. Musculoskeletal:  No back pain.  No joint pain.  No muscle tenderness. Extremities:  No pain or swelling. Skin:  No rashes or skin changes. Neuro:  No headache, numbness or weakness, balance or coordination issues. Endocrine:  No diabetes, thyroid issues, hot flashes or night sweats. Psych:  No mood changes, depression or anxiety. Pain:  No focal pain. Review of systems:  All other systems reviewed and found to be negative. As per HPI. Otherwise, a complete review of systems is negatve.  PAST MEDICAL HISTORY: Past Medical History  Diagnosis Date  . Hyperlipidemia   . Prostate cancer ([place])     2014  . Bone cancer ([place])     PAST SURGICAL HISTORY: Past Surgical History  Procedure Laterality Date  . Ankle surgery Right     FAMILY HISTORY There is no significant family history of breast cancer, ovarian cancer, colon cancer    HEALTH MAINTENANCE: Social History  Substance Use Topics  . Smoking status: Former Smoker -- 0.50 packs/day for 45 years    Types: Cigarettes    Quit date: 11/13/1991  . Smokeless tobacco: None  . Alcohol Use: 29.4 oz/week    6 Cans of beer, 43 Shots of liquor per week      Allergies  Allergen Reactions  . No Known Allergies      OBJECTIVE: Filed Vitals:   08/29/15  1405  BP: 154/91  Pulse: 63  Temp: 98.1 F (36.7 C)     Body mass index is 22.52 kg/(m^2).    ECOG FS:1 - Symptomatic but completely ambulatory  Physical Exam  General  status: Performance status is good.  Patient has not lost significant weight HEENT: No evidence of stomatitis. Sclera and conjunctivae :: No jaundice.   pale looking. Lungs: Air  entry equal on both sides.  No rhonchi.  No rales.  Cardiac: Heart sounds are normal.  No pericardial rub.  No murmur. Lymphatic system: Cervical, axillary, inguinal, lymph nodes not palpable GI: Abdomen is soft.  No  ascites.  Liver spleen not palpable.  No tenderness.  Bowel sounds are within normal limit Lower extremity: No edema Neurological system: Higher functions, cranial nerves intact no evidence of peripheral neuropathy. Skin: No rash.  No ecchymosis... LAB RESULTS: Component     Latest Ref Rng 02/14/2015 03/13/2015 04/11/2015 05/09/2015 06/06/2015  PSA     0.00 - 4.00 ng/mL 229.0 (H) 311.00 (H) 44.81 (H) 42.96 (H) 41.44 (H)   Component     Latest Ref Rng 07/04/2015  PSA     0.00 - 4.00 ng/mL 32.59 (H)   Component     Latest Ref Rng 06/06/2015 07/04/2015 08/01/2015 08/29/2015  PSA     0.00 - 4.00 ng/mL 41.44 (H) 32.59 (H) 33.18 (H) 45.99 (H)      Component Value Date/Time   NA 137 08/29/2015 1327   NA 129* 02/22/2015 0932   K 4.7 08/29/2015 1327   K 4.4 02/22/2015 0932   CL 103 08/29/2015 1327   CL 96* 02/22/2015 0932   CO2 28 08/29/2015 1327   CO2 26 02/22/2015 0932   GLUCOSE 111* 08/29/2015 1327   GLUCOSE 111* 02/22/2015 0932   BUN 17 08/29/2015 1327   BUN 25* 02/22/2015 0932   CREATININE 0.94 08/29/2015 1327   CREATININE 0.96 02/22/2015 0932   CALCIUM 9.2 08/29/2015 1327   CALCIUM 7.7* 02/22/2015 0932   PROT 8.5* 08/29/2015 1327   PROT 8.0 02/22/2015 0932   ALBUMIN 4.1 08/29/2015 1327   ALBUMIN 3.3* 02/22/2015 0932   AST 21 08/29/2015 1327   AST 65* 02/22/2015 0932   ALT 13* 08/29/2015 1327   ALT 63 02/22/2015 0932   ALKPHOS 106 08/29/2015 1327   ALKPHOS 256* 02/22/2015 0932   BILITOT 0.6 08/29/2015 1327   BILITOT 1.0 02/22/2015 0932   GFRNONAA >60 08/29/2015 1327   GFRNONAA >60 02/22/2015 0932   GFRNONAA >60 11/20/2014 0940   GFRAA >60 08/29/2015 1327   GFRAA >60 02/22/2015 0932   GFRAA >60 11/20/2014 0940    No results found for: SPEP, UPEP  Lab Results  Component Value Date   WBC 9.6 08/29/2015   NEUTROABS 7.1* 08/29/2015   HGB 12.8* 08/29/2015   HCT 39.0* 08/29/2015   MCV 86.7 08/29/2015   PLT 262 08/29/2015      Chemistry     STUDIES: Ct Chest W  Contrast  08/26/2015  CLINICAL DATA:  Prostate cancer staging. Prostate cancer metastatic to multiple sites. Bone metastases. EXAM: CT CHEST, ABDOMEN, AND PELVIS WITH CONTRAST TECHNIQUE: Multidetector CT imaging of the chest, abdomen and pelvis was performed following the standard protocol during bolus administration of intravenous contrast. CONTRAST:  161mL OMNIPAQUE IOHEXOL 300 MG/ML  SOLN COMPARISON:  Bone scan 07/04/2015 and CT chest abdomen pelvis 07/02/2015. FINDINGS: RECIST 1.1 Target Lesions: 1. Right hepatic lobe pericholecystic lesion, 1.6 cm (series 2, image 68), stable. 2. Left hepatic lobe  lesion, along the gallbladder fossa, 1.6 cm is (series 2, image 63), stable. Non-target Lesions: 1. Left lower lobe pulmonary nodule, 5 mm (series 5, image 43), stable. 2. Gastrohepatic ligament lymph node, 12 mm (series 2, image 59), stable. 3. Left pelvic sidewall lymph node, 5 mm (series 2, image 98), previously 8 mm. CT CHEST FINDINGS Mediastinum/Nodes: AP window lymph node measures 12 mm, stable. No hilar or axillary adenopathy. Atherosclerotic calcification of the arterial vasculature, including three-vessel involvement of the coronary arteries. Heart is at the upper limits of normal in size. No pericardial effusion. Tiny hiatal hernia. Lungs/Pleura: Centrilobular emphysema. Minimal scarring in the right middle lobe. A few scattered pulmonary nodules measure up to 5 mm in the left lower lobe (series 5, image 43), stable. No pleural fluid. Airway is unremarkable. Musculoskeletal: Mottled sclerosis is seen throughout the visualized osseous structures and appears grossly stable. CT ABDOMEN PELVIS FINDINGS Hepatobiliary: Combination of ill-defined low-attenuation lesions and heterogeneous larger lesions are again seen in the liver. Index lesion in the dome of the liver measures approximately 4.4 cm (series 2, image 51), grossly stable. Stones are seen in the gallbladder. No biliary ductal dilatation. Pancreas:  Negative. Spleen: Negative. Adrenals/Urinary Tract: Adrenal glands are unremarkable. Low-attenuation lesions in the kidneys measure up to 1.4 cm on the left, stable and likely cysts although definitive characterization is difficult due to size. Ureters are decompressed. Bladder is low in volume. Stomach/Bowel: Stomach, small bowel, appendix and colon are unremarkable. Vascular/Lymphatic: Atherosclerotic calcification of the arterial vasculature without abdominal aortic aneurysm. Right common iliac artery measures 2.1 cm and left common iliac artery, 1.6 cm. No pathologically enlarged lymph nodes. Largest retroperitoneal lymph node measures 6 mm in short axis in the left periaortic station (image 76), stable. Porta hepatis lymph nodes measure up to 11 mm, stable. Reproductive: Prostate appears atrophic. Other: Small bilateral inguinal hernias contain fat. No free fluid. Mesenteries and peritoneum are unremarkable. Musculoskeletal: Mottled sclerosis is seen throughout the visualized osseous structures. Mild compression of the L1 superior endplate is unchanged. IMPRESSION: 1. Hepatic and osseous metastatic disease appear grossly stable. 2. Three-vessel coronary artery calcification. 3. Cholelithiasis. 4. Right common iliac artery aneurysm. Electronically Signed   By: Lorin Picket M.D.   On: 08/26/2015 11:13   Nm Bone Scan Whole Body  08/28/2015  CLINICAL DATA:  Prostate cancer.  Metastatic disease. EXAM: NUCLEAR MEDICINE WHOLE BODY BONE SCAN TECHNIQUE: Whole body anterior and posterior images were obtained approximately 3 hours after intravenous injection of radiopharmaceutical. RADIOPHARMACEUTICALS:  23.8 mCi Technetium-65m MDP IV COMPARISON:  07/04/2015.  02/01/2015. FINDINGS: Faint visualization of the kidneys. Progressive increased uptake noted throughout the thoracolumbar spine, pelvis, proximal femurs as well as ribs bilaterally. Findings consistent with progressive metastatic disease . IMPRESSION:  Findings consistent with progressive metastatic disease. Electronically Signed   By: Marcello Moores  Register   On: 08/28/2015 14:56   Ct Abdomen Pelvis W Contrast  08/26/2015  CLINICAL DATA:  Prostate cancer staging. Prostate cancer metastatic to multiple sites. Bone metastases. EXAM: CT CHEST, ABDOMEN, AND PELVIS WITH CONTRAST TECHNIQUE: Multidetector CT imaging of the chest, abdomen and pelvis was performed following the standard protocol during bolus administration of intravenous contrast. CONTRAST:  179mL OMNIPAQUE IOHEXOL 300 MG/ML  SOLN COMPARISON:  Bone scan 07/04/2015 and CT chest abdomen pelvis 07/02/2015. FINDINGS: RECIST 1.1 Target Lesions: 1. Right hepatic lobe pericholecystic lesion, 1.6 cm (series 2, image 68), stable. 2. Left hepatic lobe lesion, along the gallbladder fossa, 1.6 cm is (series 2, image 63), stable. Non-target Lesions:  1. Left lower lobe pulmonary nodule, 5 mm (series 5, image 43), stable. 2. Gastrohepatic ligament lymph node, 12 mm (series 2, image 59), stable. 3. Left pelvic sidewall lymph node, 5 mm (series 2, image 98), previously 8 mm. CT CHEST FINDINGS Mediastinum/Nodes: AP window lymph node measures 12 mm, stable. No hilar or axillary adenopathy. Atherosclerotic calcification of the arterial vasculature, including three-vessel involvement of the coronary arteries. Heart is at the upper limits of normal in size. No pericardial effusion. Tiny hiatal hernia. Lungs/Pleura: Centrilobular emphysema. Minimal scarring in the right middle lobe. A few scattered pulmonary nodules measure up to 5 mm in the left lower lobe (series 5, image 43), stable. No pleural fluid. Airway is unremarkable. Musculoskeletal: Mottled sclerosis is seen throughout the visualized osseous structures and appears grossly stable. CT ABDOMEN PELVIS FINDINGS Hepatobiliary: Combination of ill-defined low-attenuation lesions and heterogeneous larger lesions are again seen in the liver. Index lesion in the dome of the  liver measures approximately 4.4 cm (series 2, image 51), grossly stable. Stones are seen in the gallbladder. No biliary ductal dilatation. Pancreas: Negative. Spleen: Negative. Adrenals/Urinary Tract: Adrenal glands are unremarkable. Low-attenuation lesions in the kidneys measure up to 1.4 cm on the left, stable and likely cysts although definitive characterization is difficult due to size. Ureters are decompressed. Bladder is low in volume. Stomach/Bowel: Stomach, small bowel, appendix and colon are unremarkable. Vascular/Lymphatic: Atherosclerotic calcification of the arterial vasculature without abdominal aortic aneurysm. Right common iliac artery measures 2.1 cm and left common iliac artery, 1.6 cm. No pathologically enlarged lymph nodes. Largest retroperitoneal lymph node measures 6 mm in short axis in the left periaortic station (image 76), stable. Porta hepatis lymph nodes measure up to 11 mm, stable. Reproductive: Prostate appears atrophic. Other: Small bilateral inguinal hernias contain fat. No free fluid. Mesenteries and peritoneum are unremarkable. Musculoskeletal: Mottled sclerosis is seen throughout the visualized osseous structures. Mild compression of the L1 superior endplate is unchanged. IMPRESSION: 1. Hepatic and osseous metastatic disease appear grossly stable. 2. Three-vessel coronary artery calcification. 3. Cholelithiasis. 4. Right common iliac artery aneurysm. Electronically Signed   By: Lorin Picket M.D.   On: 08/26/2015 11:13    ASSESSMENT:  Stage IV carcinoma prostate.  Castration resistant prostate cancer .  Patient is continuing XTANDI and [place] CT scan has been reviewed so stable liver metastases.  Bone scan shows some progressive metastases however needs to be reevaluated and may be needed to be repeated for confirmation in 6 weeks as per protocol requirement PSA slightly going up which will be observed.  Patient remains minimally symptomatic     Bone scan and CT scan has been reviewed independently by me and compared with the previous scan Conversation with research protocol nurse regarding if needed confirmatory bone scan in 6 weeks Continue XGEVA andFIRMAGON   Prostate cancer metastatic to multiple sites   Staging form: Prostate, AJCC 7th Edition     Clinical: Stage IV (T3, N0, M1b, PSA: 20 or greater, Gleason 8-10 - Poorly differentiated/undifferentiated (marked anaplasia)) - Signed by Forest Gleason, MD on 03/17/2015   Forest Gleason, MD   08/29/2015 2:17 PM          Smoking History: Smoking History 1(1)Packs per day and Quit 1993 after 30 yr history.(1)                         )     Advance Directive:  Advance Directive ([place]) yes(1)  Do you want to revise or change your advance directive? No(1)

## 2015-08-30 ENCOUNTER — Encounter: Payer: Self-pay | Admitting: Oncology

## 2015-09-02 ENCOUNTER — Other Ambulatory Visit: Payer: Self-pay | Admitting: *Deleted

## 2015-09-02 DIAGNOSIS — C61 Malignant neoplasm of prostate: Secondary | ICD-10-CM

## 2015-09-02 MED ORDER — PREDNISONE 5 MG PO TABS: 5.0000 mg | ORAL_TABLET | Freq: Two times a day (BID) | ORAL | Status: DC

## 2015-09-10 ENCOUNTER — Other Ambulatory Visit: Payer: Self-pay | Admitting: *Deleted

## 2015-09-10 DIAGNOSIS — C61 Malignant neoplasm of prostate: Secondary | ICD-10-CM

## 2015-09-10 MED ORDER — ABIRATERONE ACETATE 250 MG PO TABS: 1000.0000 mg | ORAL_TABLET | Freq: Every day | ORAL | Status: DC

## 2015-09-26 ENCOUNTER — Encounter: Payer: Self-pay | Admitting: *Deleted

## 2015-09-26 ENCOUNTER — Inpatient Hospital Stay (HOSPITAL_BASED_OUTPATIENT_CLINIC_OR_DEPARTMENT_OTHER): Payer: Medicare Other | Admitting: Oncology

## 2015-09-26 ENCOUNTER — Inpatient Hospital Stay: Payer: Medicare Other | Attending: Oncology

## 2015-09-26 ENCOUNTER — Inpatient Hospital Stay: Payer: Medicare Other

## 2015-09-26 ENCOUNTER — Encounter: Payer: Self-pay | Admitting: Oncology

## 2015-09-26 VITALS — BP 139/69 | HR 72 | Temp 97.0°F

## 2015-09-26 VITALS — BP 134/72 | HR 72 | Temp 97.4°F | Wt 157.2 lb

## 2015-09-26 DIAGNOSIS — R972 Elevated prostate specific antigen [PSA]: Secondary | ICD-10-CM | POA: Insufficient documentation

## 2015-09-26 DIAGNOSIS — Z79899 Other long term (current) drug therapy: Secondary | ICD-10-CM | POA: Diagnosis not present

## 2015-09-26 DIAGNOSIS — Z006 Encounter for examination for normal comparison and control in clinical research program: Secondary | ICD-10-CM | POA: Diagnosis not present

## 2015-09-26 DIAGNOSIS — C61 Malignant neoplasm of prostate: Secondary | ICD-10-CM

## 2015-09-26 DIAGNOSIS — Z87891 Personal history of nicotine dependence: Secondary | ICD-10-CM | POA: Diagnosis not present

## 2015-09-26 DIAGNOSIS — E785 Hyperlipidemia, unspecified: Secondary | ICD-10-CM | POA: Insufficient documentation

## 2015-09-26 DIAGNOSIS — Z191 Hormone sensitive malignancy status: Secondary | ICD-10-CM | POA: Insufficient documentation

## 2015-09-26 DIAGNOSIS — Z79818 Long term (current) use of other agents affecting estrogen receptors and estrogen levels: Secondary | ICD-10-CM | POA: Diagnosis not present

## 2015-09-26 DIAGNOSIS — C7951 Secondary malignant neoplasm of bone: Secondary | ICD-10-CM | POA: Insufficient documentation

## 2015-09-26 LAB — CBC WITH DIFFERENTIAL/PLATELET
Basophils Absolute: 0 10*3/uL (ref 0–0.1)
Basophils Relative: 0 %
EOS ABS: 0.1 10*3/uL (ref 0–0.7)
EOS PCT: 1 %
HCT: 35.6 % — ABNORMAL LOW (ref 40.0–52.0)
Hemoglobin: 11.8 g/dL — ABNORMAL LOW (ref 13.0–18.0)
LYMPHS ABS: 3.2 10*3/uL (ref 1.0–3.6)
LYMPHS PCT: 35 %
MCH: 28.6 pg (ref 26.0–34.0)
MCHC: 33 g/dL (ref 32.0–36.0)
MCV: 86.5 fL (ref 80.0–100.0)
MONO ABS: 0.8 10*3/uL (ref 0.2–1.0)
Monocytes Relative: 9 %
Neutro Abs: 5.2 10*3/uL (ref 1.4–6.5)
Neutrophils Relative %: 55 %
PLATELETS: 275 10*3/uL (ref 150–440)
RBC: 4.12 MIL/uL — ABNORMAL LOW (ref 4.40–5.90)
RDW: 14.5 % (ref 11.5–14.5)
WBC: 9.4 10*3/uL (ref 3.8–10.6)

## 2015-09-26 LAB — COMPREHENSIVE METABOLIC PANEL
ALT: 12 U/L — ABNORMAL LOW (ref 17–63)
ANION GAP: 7 (ref 5–15)
AST: 21 U/L (ref 15–41)
Albumin: 3.6 g/dL (ref 3.5–5.0)
Alkaline Phosphatase: 99 U/L (ref 38–126)
BUN: 19 mg/dL (ref 6–20)
CHLORIDE: 100 mmol/L — AB (ref 101–111)
CO2: 29 mmol/L (ref 22–32)
Calcium: 9.3 mg/dL (ref 8.9–10.3)
Creatinine, Ser: 0.79 mg/dL (ref 0.61–1.24)
Glucose, Bld: 130 mg/dL — ABNORMAL HIGH (ref 65–99)
POTASSIUM: 4.2 mmol/L (ref 3.5–5.1)
Sodium: 136 mmol/L (ref 135–145)
Total Bilirubin: 0.6 mg/dL (ref 0.3–1.2)
Total Protein: 7.7 g/dL (ref 6.5–8.1)

## 2015-09-26 LAB — PSA: PSA: 64.7 ng/mL — AB (ref 0.00–4.00)

## 2015-09-26 MED ORDER — INV-ENZALUTAMIDE 40 MG CAPS #120 ALLIANCE A031201: 160.0000 mg | ORAL_CAPSULE | Freq: Every day | ORAL | Status: DC

## 2015-09-26 MED ORDER — ABIRATERONE ACETATE 250 MG PO TABS: 1000.0000 mg | ORAL_TABLET | Freq: Every day | ORAL | Status: DC

## 2015-09-26 MED ORDER — DENOSUMAB 120 MG/1.7ML ~~LOC~~ SOLN
120.0000 mg | Freq: Once | SUBCUTANEOUS | Status: AC
  Administered 2015-09-26: 120 mg via SUBCUTANEOUS
  Filled 2015-09-26: qty 1.7

## 2015-09-26 MED ORDER — PREDNISONE 5 MG PO TABS: 5.0000 mg | ORAL_TABLET | Freq: Two times a day (BID) | ORAL | Status: DC

## 2015-09-26 MED ORDER — DEGARELIX ACETATE 80 MG ~~LOC~~ SOLR
80.0000 mg | Freq: Once | SUBCUTANEOUS | Status: AC
  Administered 2015-09-26: 80 mg via SUBCUTANEOUS
  Filled 2015-09-26: qty 4

## 2015-09-26 MED ORDER — DEGARELIX ACETATE 80 MG ~~LOC~~ SOLR: 80.0000 mg | Freq: Once | SUBCUTANEOUS | Status: DC

## 2015-09-26 NOTE — Progress Notes (Signed)
[place] @ [facility] Telephone:(336) [phone]  Fax:(336) [place]: [birth year]  MR#: [id number]  [id number]  Patient Care Team: Pcp Not In System as PCP - General  CHIEF COMPLAINT:  Chief Complaint  Patient presents with  . OTHER    Oncology History   1.MRI scan on August 4 of 2015  revealed in your able liver lesion and multiple areas  of   abnormallity  in the bone suggestive off metastases  2.High PSA. 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  Patient has been started on [facility] (August, 2015) and Delton See, 4.patient started on Alliance protocol with Gillermina Phy nd ZYTIGA (May, 2016)     Prostate cancer metastatic to multiple sites [facility])   03/11/2015 Initial Diagnosis Prostate cancer metastatic to multiple sites    Oncology Flowsheet 03/13/2015 04/11/2015 05/09/2015 06/06/2015 07/04/2015 08/01/2015 08/29/2015  degarelix (FIRMAGON) [place] 80 mg 80 mg 80 mg 80 mg 80 mg 80 mg 80 mg  denosumab (XGEVA) [place] 120 mg 120 mg 120 mg 120 mg 120 mg 120 mg 120 mg    INTERVAL HISTORY: HPI:   74 year old gentleman with a 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  Patient is on no Alliance protocol with ZYTIGA plus minus extend the period Getting regular XGEVA and androgen deprivation therapy Patient is here for ongoing evaluation and continuation of chemotherapy.  No chills.  No fever.  Taking XTANDI and ZYTIGA under protocol tolerating well had a CT scan of abdomen as well as bone scan done.  No increase in the bony pain appetite stable gaining weight Patient is here for ongoing evaluation and treatment consideration.  No abdominal pain.  No nausea.  No vomiting.  No diarrhea.  REVIEW OF SYSTEMS:   ROS GENERAL:  Feels good.  Active.  No fevers, sweats or weight loss. PERFORMANCE STATUS (ECOG):  01 HEENT:  No visual changes, runny nose, sore throat,  mouth sores or tenderness. Lungs: No shortness of breath or cough.  No hemoptysis. Cardiac:  No chest pain, palpitations, orthopnea, or PND. GI:  No nausea, vomiting, diarrhea, constipation, melena or hematochezia. GU:  No urgency, frequency, dysuria, or hematuria. Musculoskeletal:  No back pain.  No joint pain.  No muscle tenderness. Extremities:  No pain or swelling. Skin:  No rashes or skin changes. Neuro:  No headache, numbness or weakness, balance or coordination issues. Endocrine:  No diabetes, thyroid issues, hot flashes or night sweats. Psych:  No mood changes, depression or anxiety. Pain:  No focal pain. Review of systems:  All other systems reviewed and found to be negative. As per HPI. Otherwise, a complete review of systems is negatve.  PAST MEDICAL HISTORY: Past Medical History  Diagnosis Date  . Hyperlipidemia   . Prostate cancer ([place])     2014  . Bone cancer ([place])     PAST SURGICAL HISTORY: Past Surgical History  Procedure Laterality Date  . Ankle surgery Right     FAMILY HISTORY There is no significant family history of breast cancer, ovarian cancer, colon cancer    HEALTH MAINTENANCE: Social History  Substance Use Topics  . Smoking status: Former Smoker -- 0.50 packs/day for 45 years    Types: Cigarettes    Quit date: 11/13/1991  . Smokeless tobacco: None  . Alcohol Use: 29.4 oz/week    6 Cans of beer, 43 Shots of liquor  per week      Allergies  Allergen Reactions  . No Known Allergies      OBJECTIVE: Filed Vitals:   09/26/15 1354  BP: 134/72  Pulse: 72  Temp: 97.4 F (36.3 C)     Body mass index is 22.55 kg/(m^2).    ECOG FS:1 - Symptomatic but completely ambulatory  Physical Exam  General  status: Performance status is good.  Patient has not lost significant weight HEENT: No evidence of stomatitis. Sclera and conjunctivae :: No jaundice.   pale looking. Lungs: Air  entry equal on both sides.  No rhonchi.  No rales.  Cardiac: Heart  sounds are normal.  No pericardial rub.  No murmur. Lymphatic system: Cervical, axillary, inguinal, lymph nodes not palpable GI: Abdomen is soft.  No ascites.  Liver spleen not palpable.  No tenderness.  Bowel sounds are within normal limit Lower extremity: No edema Neurological system: Higher functions, cranial nerves intact no evidence of peripheral neuropathy. Skin: No rash.  No ecchymosis... LAB RESULTS: Component     Latest Ref Rng 02/14/2015 03/13/2015 04/11/2015 05/09/2015 06/06/2015  PSA     0.00 - 4.00 ng/mL 229.0 (H) 311.00 (H) 44.81 (H) 42.96 (H) 41.44 (H)   Component     Latest Ref Rng 07/04/2015  PSA     0.00 - 4.00 ng/mL 32.59 (H)   Component     Latest Ref Rng 06/06/2015 07/04/2015 08/01/2015 08/29/2015  PSA     0.00 - 4.00 ng/mL 41.44 (H) 32.59 (H) 33.18 (H) 45.99 (H)   4d ago (09/26/15) 94mo ago (08/29/15) 79mo ago (08/01/15) 50mo ago (07/04/15)    PSA 0.00 - 4.00 ng/mL 64.70 (H)            Component Value Date/Time   NA 136 09/26/2015 1339   NA 129* 02/22/2015 0932   K 4.2 09/26/2015 1339   K 4.4 02/22/2015 0932   CL 100* 09/26/2015 1339   CL 96* 02/22/2015 0932   CO2 29 09/26/2015 1339   CO2 26 02/22/2015 0932   GLUCOSE 130* 09/26/2015 1339   GLUCOSE 111* 02/22/2015 0932   BUN 19 09/26/2015 1339   BUN 25* 02/22/2015 0932   CREATININE 0.79 09/26/2015 1339   CREATININE 0.96 02/22/2015 0932   CALCIUM 9.3 09/26/2015 1339   CALCIUM 7.7* 02/22/2015 0932   PROT 7.7 09/26/2015 1339   PROT 8.0 02/22/2015 0932   ALBUMIN 3.6 09/26/2015 1339   ALBUMIN 3.3* 02/22/2015 0932   AST 21 09/26/2015 1339   AST 65* 02/22/2015 0932   ALT 12* 09/26/2015 1339   ALT 63 02/22/2015 0932   ALKPHOS 99 09/26/2015 1339   ALKPHOS 256* 02/22/2015 0932   BILITOT 0.6 09/26/2015 1339   BILITOT 1.0 02/22/2015 0932   GFRNONAA >60 09/26/2015 1339   GFRNONAA >60 02/22/2015 0932   GFRNONAA >60 11/20/2014 0940   GFRAA >60 09/26/2015 1339   GFRAA >60 02/22/2015 0932   GFRAA >60 11/20/2014  0940    No results found for: SPEP, UPEP  Lab Results  Component Value Date   WBC 9.4 09/26/2015   NEUTROABS 5.2 09/26/2015   HGB 11.8* 09/26/2015   HCT 35.6* 09/26/2015   MCV 86.5 09/26/2015   PLT 275 09/26/2015      Chemistry     STUDIES: Nm Bone Scan Whole Body  09/02/2015  ADDENDUM REPORT: 09/02/2015 08:09 COMPARISON:  Baseline bone scan 02/01/2015, bone scan 07/04/2015 PCWG2 Measurements for Bone Metastasis: 1. No evidence of new skeletal lesions. 2. Widespread skeletal  metastasis unchanged from baseline exam of 02/01/2015.Is Electronically Signed   By: Suzy Bouchard M.D.   On: 09/02/2015 08:09  09/02/2015  CLINICAL DATA:  Prostate cancer.  Metastatic disease. EXAM: NUCLEAR MEDICINE WHOLE BODY BONE SCAN TECHNIQUE: Whole body anterior and posterior images were obtained approximately 3 hours after intravenous injection of radiopharmaceutical. RADIOPHARMACEUTICALS:  23.8 mCi Technetium-48m MDP IV COMPARISON:  07/04/2015.  02/01/2015. FINDINGS: Faint visualization of the kidneys. Progressive increased uptake noted throughout the thoracolumbar spine, pelvis, proximal femurs as well as ribs bilaterally. Findings consistent with progressive metastatic disease . IMPRESSION: Findings consistent with progressive metastatic disease. Electronically Signed: ByMarcello Moores  Register On: 08/28/2015 14:56    ASSESSMENT:  Stage IV carcinoma prostate.  Castration resistant prostate cancer .  PSA is rising but patient remains asymptomatic patient is due for next evaluation with CT scan and a bone scan.  Further decision regarding treatment made after those information is available. All lab data has been reviewed independently Discussion with the research nurses regarding schedule according to protocol  Prostate cancer metastatic to multiple sites   Staging form: Prostate, AJCC 7th Edition     Clinical: Stage IV (T3, N0, M1b, PSA: 20 or greater, Gleason 8-10 - Poorly  differentiated/undifferentiated (marked anaplasia)) - Signed by Forest Gleason, MD on 03/17/2015   Forest Gleason, MD   09/26/2015 2:07 PM          Smoking History: Smoking History 1(1)Packs per day and Quit 1993 after 30 yr history.(1)                         )     Advance Directive:  Advance Directive ([place]) yes(1)   Do you want to revise or change your advance directive? No(1)

## 2015-09-26 NOTE — Progress Notes (Signed)
09/26/2015 @14 :05 Mr. Enderle returns to clinic this afternoon for consideration of cycle 8 treatment on Alliance [phone] study. He has returned his bottle of Enzalutamide for exchange and brought his Abiraterone for pill count and completed medication calendars as well. States he has been doing well. Reports continued hot flashes and arthralgias as well as bone pain in his right lower leg that has not changed. Also reports he has developed an occasional non-productive cough that has not been bad enough to warrant medication. Dr. Oliva Bustard in to examine patient. Weight & VS are stable. CBC and chemistries are within acceptable parameters for patient to continue on treatment. PSA will not result until tomorrow. Pill count shows compliance with medication as reported with patient returning 8 Enzalutamide capsules, which were turned in to the pharmacy and 56 Abiraterone capsules, which were returned to the patient. He also brought his new bottle of Abiraterone along with his Prednisone prescription. New bottle of Enzalutamide was dispensed from the Pharmacy and given to Mr. Uno to take home along with his new medication calendars for Cycle 8. Current adverse events along with grade and attribution are as follows:     Hot flashes - grade 1; unrelated  Cough - grade 1; unrelated Anemia - grade 1; unlikely    Arthralgia - grade 1; unrelated  Bone Pain - grade 1; unrelated

## 2015-09-27 ENCOUNTER — Telehealth: Payer: Self-pay | Admitting: *Deleted

## 2015-09-27 DIAGNOSIS — C61 Malignant neoplasm of prostate: Secondary | ICD-10-CM

## 2015-09-27 MED ORDER — FENTANYL 50 MCG/HR TD PT72: 50.0000 ug | MEDICATED_PATCH | TRANSDERMAL | Status: DC

## 2015-09-27 NOTE — Telephone Encounter (Signed)
Informed that prescription is ready to pick up  

## 2015-09-30 ENCOUNTER — Encounter: Payer: Self-pay | Admitting: Oncology

## 2015-10-22 ENCOUNTER — Encounter
Admission: RE | Admit: 2015-10-22 | Discharge: 2015-10-22 | Disposition: A | Discharge status: Home / Self Care | Payer: Medicare Other | Source: Ambulatory Visit | Attending: Oncology | Admitting: Oncology

## 2015-10-22 DIAGNOSIS — C61 Malignant neoplasm of prostate: Secondary | ICD-10-CM

## 2015-10-22 MED ORDER — TECHNETIUM TC 99M MEDRONATE IV KIT
25.0000 | PACK | Freq: Once | INTRAVENOUS | Status: AC | PRN
  Administered 2015-10-22: 23.27 via INTRAVENOUS

## 2015-10-23 ENCOUNTER — Ambulatory Visit
Admission: RE | Admit: 2015-10-23 | Discharge: 2015-10-23 | Disposition: A | Discharge status: Home / Self Care | Payer: Medicare Other | Source: Ambulatory Visit | Attending: Oncology | Admitting: Oncology

## 2015-10-23 DIAGNOSIS — C7951 Secondary malignant neoplasm of bone: Secondary | ICD-10-CM | POA: Diagnosis not present

## 2015-10-23 DIAGNOSIS — C61 Malignant neoplasm of prostate: Secondary | ICD-10-CM | POA: Insufficient documentation

## 2015-10-23 DIAGNOSIS — C787 Secondary malignant neoplasm of liver and intrahepatic bile duct: Secondary | ICD-10-CM | POA: Diagnosis not present

## 2015-10-23 DIAGNOSIS — R918 Other nonspecific abnormal finding of lung field: Secondary | ICD-10-CM | POA: Insufficient documentation

## 2015-10-23 MED ORDER — IOHEXOL 300 MG/ML  SOLN
100.0000 mL | Freq: Once | INTRAMUSCULAR | Status: AC | PRN
  Administered 2015-10-23: 100 mL via INTRAVENOUS

## 2015-10-24 ENCOUNTER — Inpatient Hospital Stay: Payer: Medicare Other

## 2015-10-24 ENCOUNTER — Ambulatory Visit: Payer: Medicare Other | Admitting: Oncology

## 2015-10-24 ENCOUNTER — Encounter: Payer: Self-pay | Admitting: Oncology

## 2015-10-24 ENCOUNTER — Other Ambulatory Visit: Payer: Medicare Other

## 2015-10-24 ENCOUNTER — Other Ambulatory Visit: Payer: Self-pay | Admitting: *Deleted

## 2015-10-24 ENCOUNTER — Other Ambulatory Visit: Payer: Self-pay | Admitting: Oncology

## 2015-10-24 ENCOUNTER — Ambulatory Visit: Payer: Medicare Other

## 2015-10-24 ENCOUNTER — Inpatient Hospital Stay: Payer: Medicare Other | Attending: Oncology

## 2015-10-24 ENCOUNTER — Inpatient Hospital Stay (HOSPITAL_BASED_OUTPATIENT_CLINIC_OR_DEPARTMENT_OTHER): Payer: Medicare Other | Admitting: Oncology

## 2015-10-24 VITALS — BP 163/91 | HR 90 | Temp 97.3°F | Resp 18 | Wt 153.2 lb

## 2015-10-24 DIAGNOSIS — E785 Hyperlipidemia, unspecified: Secondary | ICD-10-CM | POA: Diagnosis not present

## 2015-10-24 DIAGNOSIS — Z79899 Other long term (current) drug therapy: Secondary | ICD-10-CM | POA: Insufficient documentation

## 2015-10-24 DIAGNOSIS — Z006 Encounter for examination for normal comparison and control in clinical research program: Secondary | ICD-10-CM

## 2015-10-24 DIAGNOSIS — C61 Malignant neoplasm of prostate: Secondary | ICD-10-CM

## 2015-10-24 DIAGNOSIS — C787 Secondary malignant neoplasm of liver and intrahepatic bile duct: Secondary | ICD-10-CM | POA: Insufficient documentation

## 2015-10-24 DIAGNOSIS — R9721 Rising PSA following treatment for malignant neoplasm of prostate: Secondary | ICD-10-CM | POA: Insufficient documentation

## 2015-10-24 DIAGNOSIS — C7951 Secondary malignant neoplasm of bone: Secondary | ICD-10-CM | POA: Insufficient documentation

## 2015-10-24 DIAGNOSIS — Z87891 Personal history of nicotine dependence: Secondary | ICD-10-CM | POA: Insufficient documentation

## 2015-10-24 LAB — CBC WITH DIFFERENTIAL/PLATELET
BASOS PCT: 0 %
Basophils Absolute: 0 10*3/uL (ref 0–0.1)
EOS ABS: 0.1 10*3/uL (ref 0–0.7)
Eosinophils Relative: 1 %
HEMATOCRIT: 36.4 % — AB (ref 40.0–52.0)
Hemoglobin: 12.1 g/dL — ABNORMAL LOW (ref 13.0–18.0)
LYMPHS ABS: 2.1 10*3/uL (ref 1.0–3.6)
Lymphocytes Relative: 25 %
MCH: 28.6 pg (ref 26.0–34.0)
MCHC: 33.1 g/dL (ref 32.0–36.0)
MCV: 86.2 fL (ref 80.0–100.0)
MONO ABS: 0.6 10*3/uL (ref 0.2–1.0)
MONOS PCT: 7 %
Neutro Abs: 5.8 10*3/uL (ref 1.4–6.5)
Neutrophils Relative %: 67 %
Platelets: 360 10*3/uL (ref 150–440)
RBC: 4.23 MIL/uL — ABNORMAL LOW (ref 4.40–5.90)
RDW: 14.9 % — AB (ref 11.5–14.5)
WBC: 8.6 10*3/uL (ref 3.8–10.6)

## 2015-10-24 LAB — COMPREHENSIVE METABOLIC PANEL
ALBUMIN: 3.6 g/dL (ref 3.5–5.0)
ALT: 13 U/L — ABNORMAL LOW (ref 17–63)
AST: 19 U/L (ref 15–41)
Alkaline Phosphatase: 113 U/L (ref 38–126)
Anion gap: 9 (ref 5–15)
BUN: 13 mg/dL (ref 6–20)
CALCIUM: 8.9 mg/dL (ref 8.9–10.3)
CO2: 28 mmol/L (ref 22–32)
Chloride: 98 mmol/L — ABNORMAL LOW (ref 101–111)
Creatinine, Ser: 0.83 mg/dL (ref 0.61–1.24)
GFR calc non Af Amer: >60 mL/min (ref >60)
GLUCOSE: 138 mg/dL — AB (ref 65–99)
POTASSIUM: 4.2 mmol/L (ref 3.5–5.1)
SODIUM: 135 mmol/L (ref 135–145)
TOTAL PROTEIN: 8.2 g/dL — AB (ref 6.5–8.1)
Total Bilirubin: 0.4 mg/dL (ref 0.3–1.2)

## 2015-10-24 LAB — PSA: PSA: 224 ng/mL — ABNORMAL HIGH (ref 0.00–4.00)

## 2015-10-24 MED ORDER — OXYCODONE HCL 10 MG PO TABS: 10.0000 mg | ORAL_TABLET | Freq: Four times a day (QID) | ORAL | Status: DC | PRN

## 2015-10-24 MED ORDER — FENTANYL 50 MCG/HR TD PT72: 50.0000 ug | MEDICATED_PATCH | TRANSDERMAL | Status: DC

## 2015-10-24 MED ORDER — DENOSUMAB 120 MG/1.7ML ~~LOC~~ SOLN
120.0000 mg | Freq: Once | SUBCUTANEOUS | Status: AC
  Administered 2015-10-24: 120 mg via SUBCUTANEOUS
  Filled 2015-10-24: qty 1.7

## 2015-10-24 MED ORDER — DEGARELIX ACETATE 80 MG ~~LOC~~ SOLR
80.0000 mg | Freq: Once | SUBCUTANEOUS | Status: AC
  Administered 2015-10-24: 80 mg via SUBCUTANEOUS
  Filled 2015-10-24: qty 4

## 2015-10-24 NOTE — Progress Notes (Unsigned)
10/24/2015 @ 9:30am  Sean Wolfe returns to clinic today for consideration of cycle 9 chemo with Enzalutamide, Abiratarone and Prednisone. His ex-wife accompanies him today and has returned his completed medication diaries for cycle 8 and she reports he missed one dose of his medication because he accompanied her to the ER. Med calendar reflects the missed doses. Pill count for Enzalutamide shows 12 capsules returned, pill count for Abiratarone is 68 capsules which is also consistent with Med Calendars. Adverse Events reviewed with patient and with grade and attribution noted below. Patient reported nausea in the morning now after he takes Enzalutamide and plans to ask Dr. Oliva Bustard for nausea medication. Reporting insomnia which started this week and patient denies its related to hot flashes. He also reports some indigestion which he relates to his diet. 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 Informed patient that we would discontinue study treatment at this time. Patient instructed not to take Abiratarone and Prednisone and no new Enzalutamide was dispensed. Dr. Oliva Bustard explained to patient and ex-wife that he will continue to receive Delton See and Firmagon and he will begin new Taxotere in 3 weeks. With patient's consent, end of study central lab specimens were collected and shipped today per protocol.                                            Hot flashes - grade 1; unrelated Cough - grade 1; unrelated Anemia - grade 1; unlikely Arthralgia - grade 1; unrelated  Bone Pain - grade 1; unrelated    Hypertension-grade 1: possibly    Insomnia- grade 1: likely related to prednisone    Dyspepsia- grade 1: unlikely (per pt, is related to diet)    Nausea- grade  1: related to Enzalutamide

## 2015-10-24 NOTE — Progress Notes (Signed)
Patient requesting refill for pain pills, duragesic and also wants and anti-emetic.  States he is having more nausea.

## 2015-10-24 NOTE — Progress Notes (Signed)
[place] @ [facility] [phone]) [phone]  Fax:([phone]) Zeba: 1941-02-14  MR#: [id number]  [id number]  Patient Care Team: Pcp Not In System as PCP - General  CHIEF COMPLAINT:  Chief Complaint  Patient presents with  . Prostate Cancer    Oncology History   1.MRI scan on August 4 of 2015  revealed in your able liver lesion and multiple areas  of   abnormallity  in the bone suggestive off metastases  2.High PSA. 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  Patient has been started on [facility] (August, 2015) and Delton See, 4.patient started on [facility] protocol with Gillermina Phy nd ZYTIGA (May, 2016)     Prostate cancer metastatic to multiple sites [facility])   03/11/2015 Initial Diagnosis Prostate cancer metastatic to multiple sites    Oncology Flowsheet 04/11/2015 05/09/2015 06/06/2015 07/04/2015 08/01/2015 08/29/2015 09/26/2015  degarelix (FIRMAGON) [place] 80 mg 80 mg 80 mg 80 mg 80 mg 80 mg 80 mg  denosumab (XGEVA) [place] 120 mg 120 mg 120 mg 120 mg 120 mg 120 mg 120 mg    INTERVAL HISTORY: HPI:   74 year old gentleman with a 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  Patient is on no [facility] protocol with ZYTIGA plus minus extend the period Getting regular XGEVA and androgen deprivation therapy Patient is here for ongoing evaluation and continuation of chemotherapy.  No chills.  No fever.  Taking XTANDI and ZYTIGA under protocol tolerating well had a CT scan of abdomen as well as bone scan done.  No increase in the bony pain appetite stable gaining weight Patient is here for ongoing evaluation and treatment consideration.  No abdominal pain.  No nausea.  No vomiting.  No diarrhea. Patient had experience increasing pain in the right upper quadrant. No nausea.  No vomiting. Had a repeat CT scan. PSA is rising. REVIEW OF SYSTEMS:    ROS GENERAL:  Feels good.  Active.  No fevers, sweats or weight loss. PERFORMANCE STATUS (ECOG):  01 HEENT:  No visual changes, runny nose, sore throat, mouth sores or tenderness. Lungs: No shortness of breath or cough.  No hemoptysis. Cardiac:  No chest pain, palpitations, orthopnea, or PND. GI: patient has increasing pain in the right upper quadrant. GU:  No urgency, frequency, dysuria, or hematuria. Musculoskeletal:  No back pain.  No joint pain.  No muscle tenderness. Extremities:  No pain or swelling. Skin:  No rashes or skin changes. Neuro:  No headache, numbness or weakness, balance or coordination issues. Endocrine:  No diabetes, thyroid issues, hot flashes or night sweats. Psych:  No mood changes, depression or anxiety. Pain:  No focal pain. Review of systems:  All other systems reviewed and found to be negative. As per HPI. Otherwise, a complete review of systems is negatve.  PAST MEDICAL HISTORY: Past Medical History  Diagnosis Date  . Hyperlipidemia   . Prostate cancer ([place])     2014  . Bone cancer ([place])     PAST SURGICAL HISTORY: Past Surgical History  Procedure Laterality Date  . Ankle surgery Right     FAMILY HISTORY There is no significant family history of breast cancer, ovarian cancer, colon cancer    HEALTH MAINTENANCE: Social History  Substance Use Topics  . Smoking status: Former Smoker -- 0.50 packs/day for 45 years    Types: Cigarettes    Quit date:  11/13/1991  . Smokeless tobacco: None  . Alcohol Use: 29.4 oz/week    6 Cans of beer, 43 Shots of liquor per week      Allergies  Allergen Reactions  . No Known Allergies      OBJECTIVE: Filed Vitals:   10/24/15 0938  BP: 163/91  Pulse: 90  Temp: 97.3 F (36.3 C)  Resp: 18     Body mass index is 21.98 kg/(m^2).    ECOG FS:1 - Symptomatic but completely ambulatory  Physical Exam  General  status: Performance status is good.  Patient has not lost significant weight HEENT: No  evidence of stomatitis. Sclera and conjunctivae :: No jaundice.   pale looking. Lungs: Air  entry equal on both sides.  No rhonchi.  No rales.  Cardiac: Heart sounds are normal.  No pericardial rub.  No murmur. Lymphatic system: Cervical, axillary, inguinal, lymph nodes not palpable GI: Abdomen is soft.  No ascites.  Liver spleen not palpable.  No tenderness.  Bowel sounds are within normal limit Lower extremity: No edema Neurological system: Higher functions, cranial nerves intact no evidence of peripheral neuropathy. Skin: No rash.  No ecchymosis... LAB RESULTS: Component     Latest Ref Rng 02/14/2015 03/13/2015 04/11/2015 05/09/2015 06/06/2015  PSA     0.00 - 4.00 ng/mL 229.0 (H) 311.00 (H) 44.81 (H) 42.96 (H) 41.44 (H)   Component     Latest Ref Rng 07/04/2015  PSA     0.00 - 4.00 ng/mL 32.59 (H)   Component     Latest Ref Rng 06/06/2015 07/04/2015 08/01/2015 08/29/2015  PSA     0.00 - 4.00 ng/mL 41.44 (H) 32.59 (H) 33.18 (H) 45.99 (H)   4d ago (09/26/15) 53mo ago (08/29/15) 77mo ago (08/01/15) 66mo ago (07/04/15)    PSA 0.00 - 4.00 ng/mL 64.70 (H)            Component Value Date/Time   NA 135 10/24/2015 0903   NA 129* 02/22/2015 0932   K 4.2 10/24/2015 0903   K 4.4 02/22/2015 0932   CL 98* 10/24/2015 0903   CL 96* 02/22/2015 0932   CO2 28 10/24/2015 0903   CO2 26 02/22/2015 0932   GLUCOSE 138* 10/24/2015 0903   GLUCOSE 111* 02/22/2015 0932   BUN 13 10/24/2015 0903   BUN 25* 02/22/2015 0932   CREATININE 0.83 10/24/2015 0903   CREATININE 0.96 02/22/2015 0932   CALCIUM 8.9 10/24/2015 0903   CALCIUM 7.7* 02/22/2015 0932   PROT 8.2* 10/24/2015 0903   PROT 8.0 02/22/2015 0932   ALBUMIN 3.6 10/24/2015 0903   ALBUMIN 3.3* 02/22/2015 0932   AST 19 10/24/2015 0903   AST 65* 02/22/2015 0932   ALT 13* 10/24/2015 0903   ALT 63 02/22/2015 0932   ALKPHOS 113 10/24/2015 0903   ALKPHOS 256* 02/22/2015 0932   BILITOT 0.4 10/24/2015 0903   BILITOT 1.0 02/22/2015 0932   GFRNONAA >60  10/24/2015 0903   GFRNONAA >60 02/22/2015 0932   GFRNONAA >60 11/20/2014 0940   GFRAA >60 10/24/2015 0903   GFRAA >60 02/22/2015 0932   GFRAA >60 11/20/2014 0940    No results found for: SPEP, UPEP  Lab Results  Component Value Date   WBC 8.6 10/24/2015   NEUTROABS 5.8 10/24/2015   HGB 12.1* 10/24/2015   HCT 36.4* 10/24/2015   MCV 86.2 10/24/2015   PLT 360 10/24/2015      Chemistry     STUDIES: Ct Chest W Contrast  10/23/2015  CLINICAL DATA:  Prostate cancer with bone metastasis. RECIST 1.1 protocol EXAM: CT CHEST, ABDOMEN, AND PELVIS WITH CONTRAST TECHNIQUE: Multidetector CT imaging of the chest, abdomen and pelvis was performed following the standard protocol during bolus administration of intravenous contrast. CONTRAST:  16mL OMNIPAQUE IOHEXOL 300 MG/ML  SOLN COMPARISON:  CT 08/26/2015 RECIST 1.1 Target Lesions: 1. Right hepatic lobe pericholecystic lesion, 1.3 cm (series 2, image 67), previously 1.3 cm (corrected from previous 1.6 cm measurement) 2. LEFT Hepatic lobe lesion adjacent gallbladder fossa measures 1.4 cm (image 63, series 2) compared to 1.6 cm. Non-target Lesions: 1. Left lower lobe pulmonary nodule, 5 mm (series 4, image 40), stable. 2. Gastrohepatic ligament lymph node, 12 mm (series 2, image 57), Stable. 3. Left pelvic sidewall lymph node, 4 mm (series 2, image 97), previously 5 mm. FINDINGS: CT CHEST FINDINGS CT CHEST FINDINGS Mediastinum/Nodes: No mediastinal hilar lymphadenopathy. No axillary or supraclavicular adenopathy. No pericardial fluid. No central pulmonary embolism. Lungs/Pleura: 5 mm nodule in the LEFT lower lobe on image 40, series is unchanged. 3 mm nodule in the LEFT lower lobe on image 31 is unchanged. 4 mm nodule in the RIGHT lower lobe on image 31 is more prominent than 3 mm nodule on prior. No new nodules. Musculoskeletal: No aggressive osseous lesion. CT ABDOMEN AND PELVIS FINDINGS Hepatobiliary: Marked increase in size of rounded lesion in the  superior aspect the RIGHT hepatic lobe measuring 7.2 x 8.0 cm (image 48, series 2) increased from 3.1 by 2.9 cm on prior. The subcapsular lesion the anterior LEFT hepatic lobe measures 3.4 cm unchanged from 3.6 cm (image 59, series 2. The target lesions for RECIST criteria are unchanged.  See above. Pancreas: Pancreas is normal. No ductal dilatation. No pancreatic inflammation. Spleen: Normal spleen Adrenals/urinary tract: Adrenal glands are normal. Stable small simple fluid attenuation cortical lesion the LEFT kidney. Ureters and bladder normal. Stomach/Bowel: Stomach, small bowel, appendix, and cecum are normal. The colon and rectosigmoid colon are normal. Vascular/Lymphatic: Abdominal aorta is normal caliber with atherosclerotic calcification. There is no retroperitoneal or periportal lymphadenopathy. No pelvic lymphadenopathy. Small gastrohepatic ligament and periportal lymph nodes are not changed size. Example 12 mm gastrohepatic ligament lymph node on image 57, series 2. 13 mm short axis periportal lymph node on image 60. Reproductive: Prostate normal Other: No peritoneal disease. Musculoskeletal: Widespread sclerotic skeletal metastasis. The sclerotic metastasis is nearly completed in the axillary skeleton and patchy in the proximal appendicular skeleton. IMPRESSION: Chest Impression: 1. Stable LEFT lower lobe pulmonary nodules. One RIGHT lower lobe pulmonary nodule is more prominent. Recommend attention on follow-up. 2. No mediastinal adenopathy. Abdomen / Pelvis Impression: 1. 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 2. Other hepatic lesions are stable including RECIST target lesions. 3. Stable skeletal metastasis. Electronically Signed   By: Suzy Bouchard M.D.   On: 10/23/2015 11:44   Nm Bone Scan Whole Body  10/22/2015  CLINICAL DATA:  Prostate cancer with skeletal metastasis. Multiple fractures from prior trauma. Research protocol.[facility] [phone] research protocol EXAM:  NUCLEAR MEDICINE WHOLE BODY BONE SCAN TECHNIQUE: Whole body anterior and posterior images were obtained approximately 3 hours after intravenous injection of radiopharmaceutical. RADIOPHARMACEUTICALS:  23.27 mCi Technetium-62m MDP IV COMPARISON:  08/28/2015, baseline 07/04/2015 PCWG2 Measurements for Bone Metastasis: 1. No evidence of new skeletal lesions. 2. Widespread skeletal metastasis unchanged from baseline exam of 02/01/2015. FINDINGS: There are multiple sites of abnormal uptake within the thoracic and lumbar spine as well as the pelvis and proximal femurs which are not changed from prior.  Uptake in the RIGHT shoulder is unchanged. Mottled uptake within the posterior rib is unchanged. Uptake in the anterior lateral fourth rib on the LEFT is also unchanged. No evidence of new sites of metastatic disease. IMPRESSION: Multiple sites of skeletal metastasis within the ribs, spine, pelvis, and hand proximal femurs are not changed from comparison exam of 08/28/2015. Stable over additional exams. Electronically Signed   By: Suzy Bouchard M.D.   On: 10/22/2015 14:32   Ct Abdomen Pelvis W Contrast  10/23/2015  CLINICAL DATA:  Prostate cancer with bone metastasis. RECIST 1.1 protocol EXAM: CT CHEST, ABDOMEN, AND PELVIS WITH CONTRAST TECHNIQUE: Multidetector CT imaging of the chest, abdomen and pelvis was performed following the standard protocol during bolus administration of intravenous contrast. CONTRAST:  142mL OMNIPAQUE IOHEXOL 300 MG/ML  SOLN COMPARISON:  CT 08/26/2015 RECIST 1.1 Target Lesions: 1. Right hepatic lobe pericholecystic lesion, 1.3 cm (series 2, image 67), previously 1.3 cm (corrected from previous 1.6 cm measurement) 2. LEFT Hepatic lobe lesion adjacent gallbladder fossa measures 1.4 cm (image 63, series 2) compared to 1.6 cm. Non-target Lesions: 1. Left lower lobe pulmonary nodule, 5 mm (series 4, image 40), stable. 2. Gastrohepatic ligament lymph node, 12 mm (series 2, image 57), Stable. 3.  Left pelvic sidewall lymph node, 4 mm (series 2, image 97), previously 5 mm. FINDINGS: CT CHEST FINDINGS CT CHEST FINDINGS Mediastinum/Nodes: No mediastinal hilar lymphadenopathy. No axillary or supraclavicular adenopathy. No pericardial fluid. No central pulmonary embolism. Lungs/Pleura: 5 mm nodule in the LEFT lower lobe on image 40, series is unchanged. 3 mm nodule in the LEFT lower lobe on image 31 is unchanged. 4 mm nodule in the RIGHT lower lobe on image 31 is more prominent than 3 mm nodule on prior. No new nodules. Musculoskeletal: No aggressive osseous lesion. CT ABDOMEN AND PELVIS FINDINGS Hepatobiliary: Marked increase in size of rounded lesion in the superior aspect the RIGHT hepatic lobe measuring 7.2 x 8.0 cm (image 48, series 2) increased from 3.1 by 2.9 cm on prior. The subcapsular lesion the anterior LEFT hepatic lobe measures 3.4 cm unchanged from 3.6 cm (image 59, series 2. The target lesions for RECIST criteria are unchanged.  See above. Pancreas: Pancreas is normal. No ductal dilatation. No pancreatic inflammation. Spleen: Normal spleen Adrenals/urinary tract: Adrenal glands are normal. Stable small simple fluid attenuation cortical lesion the LEFT kidney. Ureters and bladder normal. Stomach/Bowel: Stomach, small bowel, appendix, and cecum are normal. The colon and rectosigmoid colon are normal. Vascular/Lymphatic: Abdominal aorta is normal caliber with atherosclerotic calcification. There is no retroperitoneal or periportal lymphadenopathy. No pelvic lymphadenopathy. Small gastrohepatic ligament and periportal lymph nodes are not changed size. Example 12 mm gastrohepatic ligament lymph node on image 57, series 2. 13 mm short axis periportal lymph node on image 60. Reproductive: Prostate normal Other: No peritoneal disease. Musculoskeletal: Widespread sclerotic skeletal metastasis. The sclerotic metastasis is nearly completed in the axillary skeleton and patchy in the proximal appendicular  skeleton. IMPRESSION: Chest Impression: 1. Stable LEFT lower lobe pulmonary nodules. One RIGHT lower lobe pulmonary nodule is more prominent. Recommend attention on follow-up. 2. No mediastinal adenopathy. Abdomen / Pelvis Impression: 1. 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 2. Other hepatic lesions are stable including RECIST target lesions. 3. Stable skeletal metastasis. Electronically Signed   By: Suzy Bouchard M.D.   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  PSA is rising but patient remains asymptomatic patient  is due for next evaluation with CT scan and a bone scan.  Further decision regarding treatment made after those information is available. All lab data has been reviewed independently Discussion with the research nurses regarding schedule according to protocol  Prostate cancer metastatic to multiple sites   Staging form: Prostate, AJCC 7th Edition     Clinical: Stage IV (T3, N0, M1b, PSA: 20 or greater, Gleason 8-10 - Poorly differentiated/undifferentiated (marked anaplasia)) - Signed by Forest Gleason, MD on 03/17/2015   Forest Gleason, MD   10/24/2015 10:10 AM          Smoking History: Smoking History 1(1)Packs per day and Quit 1993 after 30 yr history.(1)                         )     Advance Directive:  Advance Directive ([place]) yes(1)   Do you want to revise or change your advance directive? No(1)          PAST MEDICAL HISTORY: Past Medical History  Diagnosis Date  . Hyperlipidemia   . Prostate cancer ([place])     2014  . Bone cancer ([place])     PAST SURGICAL HISTORY: Past Surgical History  Procedure Laterality Date  . Ankle surgery Right     FAMILY HISTORY There is no significant family history of breast cancer, ovarian cancer, colon cancer    HEALTH MAINTENANCE: Social History  Substance Use Topics  . Smoking status: Former  Smoker -- 0.50 packs/day for 45 years    Types: Cigarettes    Quit date: 11/13/1991  . Smokeless tobacco: None  . Alcohol Use: 29.4 oz/week    6 Cans of beer, 43 Shots of liquor per week      Allergies  Allergen Reactions  . No Known Allergies      OBJECTIVE: Filed Vitals:   10/24/15 0938  BP: 163/91  Pulse: 90  Temp: 97.3 F (36.3 C)  Resp: 18     Body mass index is 21.98 kg/(m^2).    ECOG FS:1 - Symptomatic but completely ambulatory  Physical Exam  General  status: Performance status is good.  Patient has not lost significant weight HEENT: No evidence of stomatitis. Sclera and conjunctivae :: No jaundice.   pale looking. Lungs: Air  entry equal on both sides.  No rhonchi.  No rales.  Cardiac: Heart sounds are normal.  No pericardial rub.  No murmur. Lymphatic system: Cervical, axillary, inguinal, lymph nodes not palpable GI: Abdomen is soft.  No ascites.  Liver spleen not palpable.  No tenderness.  Bowel sounds are within normal limit Lower extremity: No edema Neurological system: Higher functions, cranial nerves intact no evidence of peripheral neuropathy. Skin: No rash.  No ecchymosis... LAB RESULTS: Component     Latest Ref Rng 02/14/2015 03/13/2015 04/11/2015 05/09/2015 06/06/2015  PSA     0.00 - 4.00 ng/mL 229.0 (H) 311.00 (H) 44.81 (H) 42.96 (H) 41.44 (H)   Component     Latest Ref Rng 07/04/2015  PSA     0.00 - 4.00 ng/mL 32.59 (H)   Component     Latest Ref Rng 06/06/2015 07/04/2015 08/01/2015 08/29/2015  PSA     0.00 - 4.00 ng/mL 41.44 (H) 32.59 (H) 33.18 (H) 45.99 (H)   4d ago (09/26/15) 92mo ago (08/29/15) 20mo ago (08/01/15) 21mo ago (07/04/15)    PSA 0.00 - 4.00 ng/mL 64.70 (H)  Component Value Date/Time   NA 135 10/24/2015 0903   NA 129* 02/22/2015 0932   K 4.2 10/24/2015 0903   K 4.4 02/22/2015 0932   CL 98* 10/24/2015 0903   CL 96* 02/22/2015 0932   CO2 28 10/24/2015 0903   CO2 26 02/22/2015 0932   GLUCOSE 138* 10/24/2015 0903    GLUCOSE 111* 02/22/2015 0932   BUN 13 10/24/2015 0903   BUN 25* 02/22/2015 0932   CREATININE 0.83 10/24/2015 0903   CREATININE 0.96 02/22/2015 0932   CALCIUM 8.9 10/24/2015 0903   CALCIUM 7.7* 02/22/2015 0932   PROT 8.2* 10/24/2015 0903   PROT 8.0 02/22/2015 0932   ALBUMIN 3.6 10/24/2015 0903   ALBUMIN 3.3* 02/22/2015 0932   AST 19 10/24/2015 0903   AST 65* 02/22/2015 0932   ALT 13* 10/24/2015 0903   ALT 63 02/22/2015 0932   ALKPHOS 113 10/24/2015 0903   ALKPHOS 256* 02/22/2015 0932   BILITOT 0.4 10/24/2015 0903   BILITOT 1.0 02/22/2015 0932   GFRNONAA >60 10/24/2015 0903   GFRNONAA >60 02/22/2015 0932   GFRNONAA >60 11/20/2014 0940   GFRAA >60 10/24/2015 0903   GFRAA >60 02/22/2015 0932   GFRAA >60 11/20/2014 0940    No results found for: SPEP, UPEP  Lab Results  Component Value Date   WBC 8.6 10/24/2015   NEUTROABS 5.8 10/24/2015   HGB 12.1* 10/24/2015   HCT 36.4* 10/24/2015   MCV 86.2 10/24/2015   PLT 360 10/24/2015      Chemistry     STUDIES: Ct Chest W Contrast  10/23/2015  CLINICAL DATA:  Prostate cancer with bone metastasis. RECIST 1.1 protocol EXAM: CT CHEST, ABDOMEN, AND PELVIS WITH CONTRAST TECHNIQUE: Multidetector CT imaging of the chest, abdomen and pelvis was performed following the standard protocol during bolus administration of intravenous contrast. CONTRAST:  171mL OMNIPAQUE IOHEXOL 300 MG/ML  SOLN COMPARISON:  CT 08/26/2015 RECIST 1.1 Target Lesions: 1. Right hepatic lobe pericholecystic lesion, 1.3 cm (series 2, image 67), previously 1.3 cm (corrected from previous 1.6 cm measurement) 2. LEFT Hepatic lobe lesion adjacent gallbladder fossa measures 1.4 cm (image 63, series 2) compared to 1.6 cm. Non-target Lesions: 1. Left lower lobe pulmonary nodule, 5 mm (series 4, image 40), stable. 2. Gastrohepatic ligament lymph node, 12 mm (series 2, image 57), Stable. 3. Left pelvic sidewall lymph node, 4 mm (series 2, image 97), previously 5 mm. FINDINGS: CT  CHEST FINDINGS CT CHEST FINDINGS Mediastinum/Nodes: No mediastinal hilar lymphadenopathy. No axillary or supraclavicular adenopathy. No pericardial fluid. No central pulmonary embolism. Lungs/Pleura: 5 mm nodule in the LEFT lower lobe on image 40, series is unchanged. 3 mm nodule in the LEFT lower lobe on image 31 is unchanged. 4 mm nodule in the RIGHT lower lobe on image 31 is more prominent than 3 mm nodule on prior. No new nodules. Musculoskeletal: No aggressive osseous lesion. CT ABDOMEN AND PELVIS FINDINGS Hepatobiliary: Marked increase in size of rounded lesion in the superior aspect the RIGHT hepatic lobe measuring 7.2 x 8.0 cm (image 48, series 2) increased from 3.1 by 2.9 cm on prior. The subcapsular lesion the anterior LEFT hepatic lobe measures 3.4 cm unchanged from 3.6 cm (image 59, series 2. The target lesions for RECIST criteria are unchanged.  See above. Pancreas: Pancreas is normal. No ductal dilatation. No pancreatic inflammation. Spleen: Normal spleen Adrenals/urinary tract: Adrenal glands are normal. Stable small simple fluid attenuation cortical lesion the LEFT kidney. Ureters and bladder normal. Stomach/Bowel: Stomach, small bowel, appendix, and  cecum are normal. The colon and rectosigmoid colon are normal. Vascular/Lymphatic: Abdominal aorta is normal caliber with atherosclerotic calcification. There is no retroperitoneal or periportal lymphadenopathy. No pelvic lymphadenopathy. Small gastrohepatic ligament and periportal lymph nodes are not changed size. Example 12 mm gastrohepatic ligament lymph node on image 57, series 2. 13 mm short axis periportal lymph node on image 60. Reproductive: Prostate normal Other: No peritoneal disease. Musculoskeletal: Widespread sclerotic skeletal metastasis. The sclerotic metastasis is nearly completed in the axillary skeleton and patchy in the proximal appendicular skeleton. IMPRESSION: Chest Impression: 1. Stable LEFT lower lobe pulmonary nodules. One RIGHT  lower lobe pulmonary nodule is more prominent. Recommend attention on follow-up. 2. No mediastinal adenopathy. Abdomen / Pelvis Impression: 1. 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 2. Other hepatic lesions are stable including RECIST target lesions. 3. Stable skeletal metastasis. Electronically Signed   By: Suzy Bouchard M.D.   On: 10/23/2015 11:44   Nm Bone Scan Whole Body  10/22/2015  CLINICAL DATA:  Prostate cancer with skeletal metastasis. Multiple fractures from prior trauma. Research protocol.[facility] [phone] research protocol EXAM: NUCLEAR MEDICINE WHOLE BODY BONE SCAN TECHNIQUE: Whole body anterior and posterior images were obtained approximately 3 hours after intravenous injection of radiopharmaceutical. RADIOPHARMACEUTICALS:  23.27 mCi Technetium-80m MDP IV COMPARISON:  08/28/2015, baseline 07/04/2015 PCWG2 Measurements for Bone Metastasis: 1. No evidence of new skeletal lesions. 2. Widespread skeletal metastasis unchanged from baseline exam of 02/01/2015. FINDINGS: There are multiple sites of abnormal uptake within the thoracic and lumbar spine as well as the pelvis and proximal femurs which are not changed from prior. Uptake in the RIGHT shoulder is unchanged. Mottled uptake within the posterior rib is unchanged. Uptake in the anterior lateral fourth rib on the LEFT is also unchanged. No evidence of new sites of metastatic disease. IMPRESSION: Multiple sites of skeletal metastasis within the ribs, spine, pelvis, and hand proximal femurs are not changed from comparison exam of 08/28/2015. Stable over additional exams. Electronically Signed   By: Suzy Bouchard M.D.   On: 10/22/2015 14:32   Ct Abdomen Pelvis W Contrast  10/23/2015  CLINICAL DATA:  Prostate cancer with bone metastasis. RECIST 1.1 protocol EXAM: CT CHEST, ABDOMEN, AND PELVIS WITH CONTRAST TECHNIQUE: Multidetector CT imaging of the chest, abdomen and pelvis was performed following the standard protocol  during bolus administration of intravenous contrast. CONTRAST:  151mL OMNIPAQUE IOHEXOL 300 MG/ML  SOLN COMPARISON:  CT 08/26/2015 RECIST 1.1 Target Lesions: 1. Right hepatic lobe pericholecystic lesion, 1.3 cm (series 2, image 67), previously 1.3 cm (corrected from previous 1.6 cm measurement) 2. LEFT Hepatic lobe lesion adjacent gallbladder fossa measures 1.4 cm (image 63, series 2) compared to 1.6 cm. Non-target Lesions: 1. Left lower lobe pulmonary nodule, 5 mm (series 4, image 40), stable. 2. Gastrohepatic ligament lymph node, 12 mm (series 2, image 57), Stable. 3. Left pelvic sidewall lymph node, 4 mm (series 2, image 97), previously 5 mm. FINDINGS: CT CHEST FINDINGS CT CHEST FINDINGS Mediastinum/Nodes: No mediastinal hilar lymphadenopathy. No axillary or supraclavicular adenopathy. No pericardial fluid. No central pulmonary embolism. Lungs/Pleura: 5 mm nodule in the LEFT lower lobe on image 40, series is unchanged. 3 mm nodule in the LEFT lower lobe on image 31 is unchanged. 4 mm nodule in the RIGHT lower lobe on image 31 is more prominent than 3 mm nodule on prior. No new nodules. Musculoskeletal: No aggressive osseous lesion. CT ABDOMEN AND PELVIS FINDINGS Hepatobiliary: Marked increase in size of rounded lesion in the superior aspect  the RIGHT hepatic lobe measuring 7.2 x 8.0 cm (image 48, series 2) increased from 3.1 by 2.9 cm on prior. The subcapsular lesion the anterior LEFT hepatic lobe measures 3.4 cm unchanged from 3.6 cm (image 59, series 2. The target lesions for RECIST criteria are unchanged.  See above. Pancreas: Pancreas is normal. No ductal dilatation. No pancreatic inflammation. Spleen: Normal spleen Adrenals/urinary tract: Adrenal glands are normal. Stable small simple fluid attenuation cortical lesion the LEFT kidney. Ureters and bladder normal. Stomach/Bowel: Stomach, small bowel, appendix, and cecum are normal. The colon and rectosigmoid colon are normal. Vascular/Lymphatic: Abdominal  aorta is normal caliber with atherosclerotic calcification. There is no retroperitoneal or periportal lymphadenopathy. No pelvic lymphadenopathy. Small gastrohepatic ligament and periportal lymph nodes are not changed size. Example 12 mm gastrohepatic ligament lymph node on image 57, series 2. 13 mm short axis periportal lymph node on image 60. Reproductive: Prostate normal Other: No peritoneal disease. Musculoskeletal: Widespread sclerotic skeletal metastasis. The sclerotic metastasis is nearly completed in the axillary skeleton and patchy in the proximal appendicular skeleton. IMPRESSION: Chest Impression: 1. Stable LEFT lower lobe pulmonary nodules. One RIGHT lower lobe pulmonary nodule is more prominent. Recommend attention on follow-up. 2. No mediastinal adenopathy. Abdomen / Pelvis Impression: 1. 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 2. Other hepatic lesions are stable including RECIST target lesions. 3. Stable skeletal metastasis. Electronically Signed   By: Suzy Bouchard M.D.   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  Patient is progressing based on CT scan and a PSA. CT scan has been reviewed independently and reviewed with the patient and family. Need for pain medication has been increased We will continue fentanyl patch and oxycodone for pain control  I also had discussion regarding protocol and by protocol criteria.  Will take patient off protocol at present time  Patient had been randomized to get both XTANDI and ZYTIGA  Possibility of starting chemotherapy with Taxotere has been discussed.  Intent of chemotherapy is palliation and relief in symptoms and extending survival  Patient is agreeable to it and will start patient on Taxotere Patient will attend chemotherapy class  Also discussed possibility of port placement  Total duration of visit was 45 minutes.  50% or more time was spent  in counseling patient and family regarding prognosis .  Progressive disease.   Prostate cancer metastatic to multiple sites   Staging form: Prostate, AJCC 7th Edition     Clinical: Stage IV (T3, N0, M1b, PSA: 20 or greater, Gleason 8-10 - Poorly differentiated/undifferentiated (marked anaplasia)) - Signed by Forest Gleason, MD on 03/17/2015   Forest Gleason, MD   10/24/2015 11:21 AM                                   )

## 2015-11-01 NOTE — Patient Instructions (Signed)
Docetaxel injection  What is this medicine?  DOCETAXEL (doe se TAX el) is a chemotherapy drug. It targets fast dividing cells, like cancer cells, and causes these cells to die. This medicine is used to treat many types of cancers like breast cancer, certain stomach cancers, head and neck cancer, lung cancer, and prostate cancer.  This medicine may be used for other purposes; ask your health care provider or pharmacist if you have questions.  What should I tell my health care provider before I take this medicine?  They need to know if you have any of these conditions:  -infection (especially a virus infection such as chickenpox, cold sores, or herpes)  -liver disease  -low blood counts, like low white cell, platelet, or red cell counts  -an unusual or allergic reaction to docetaxel, polysorbate 80, other chemotherapy agents, other medicines, foods, dyes, or preservatives  -pregnant or trying to get pregnant  -breast-feeding  How should I use this medicine?  This drug is given as an infusion into a vein. It is administered in a hospital or clinic by a specially trained health care professional.  Talk to your pediatrician regarding the use of this medicine in children. Special care may be needed.  Overdosage: If you think you have taken too much of this medicine contact a poison control center or emergency room at once.  NOTE: This medicine is only for you. Do not share this medicine with others.  What if I miss a dose?  It is important not to miss your dose. Call your doctor or health care professional if you are unable to keep an appointment.  What may interact with this medicine?  -cyclosporine  -erythromycin  -ketoconazole  -medicines to increase blood counts like filgrastim, pegfilgrastim, sargramostim  -vaccines  Talk to your doctor or health care professional before taking any of these medicines:  -acetaminophen  -aspirin  -ibuprofen  -ketoprofen  -naproxen  This list may not describe all possible interactions.  Give your health care provider a list of all the medicines, herbs, non-prescription drugs, or dietary supplements you use. Also tell them if you smoke, drink alcohol, or use illegal drugs. Some items may interact with your medicine.  What should I watch for while using this medicine?  Your condition will be monitored carefully while you are receiving this medicine. You will need important blood work done while you are taking this medicine.  This drug may make you feel generally unwell. This is not uncommon, as chemotherapy can affect healthy cells as well as cancer cells. Report any side effects. Continue your course of treatment even though you feel ill unless your doctor tells you to stop.  In some cases, you may be given additional medicines to help with side effects. Follow all directions for their use.  Call your doctor or health care professional for advice if you get a fever, chills or sore throat, or other symptoms of a cold or flu. Do not treat yourself. This drug decreases your body's ability to fight infections. Try to avoid being around people who are sick.  This medicine may increase your risk to bruise or bleed. Call your doctor or health care professional if you notice any unusual bleeding.  This medicine may contain alcohol in the product. You may get drowsy or dizzy. Do not drive, use machinery, or do anything that needs mental alertness until you know how this medicine affects you. Do not stand or sit up quickly, especially if you are an older   patient. This reduces the risk of dizzy or fainting spells. Avoid alcoholic drinks.  Do not become pregnant while taking this medicine. Women should inform their doctor if they wish to become pregnant or think they might be pregnant. There is a potential for serious side effects to an unborn child. Talk to your health care professional or pharmacist for more information. Do not breast-feed an infant while taking this medicine.  What side effects may I notice  from receiving this medicine?  Side effects that you should report to your doctor or health care professional as soon as possible:  -allergic reactions like skin rash, itching or hives, swelling of the face, lips, or tongue  -low blood counts - This drug may decrease the number of white blood cells, red blood cells and platelets. You may be at increased risk for infections and bleeding.  -signs of infection - fever or chills, cough, sore throat, pain or difficulty passing urine  -signs of decreased platelets or bleeding - bruising, pinpoint red spots on the skin, black, tarry stools, nosebleeds  -signs of decreased red blood cells - unusually weak or tired, fainting spells, lightheadedness  -breathing problems  -fast or irregular heartbeat  -low blood pressure  -mouth sores  -nausea and vomiting  -pain, swelling, redness or irritation at the injection site  -pain, tingling, numbness in the hands or feet  -swelling of the ankle, feet, hands  -weight gain  Side effects that usually do not require medical attention (report to your prescriber or health care professional if they continue or are bothersome):  -bone pain  -complete hair loss including hair on your head, underarms, pubic hair, eyebrows, and eyelashes  -diarrhea  -excessive tearing  -changes in the color of fingernails  -loosening of the fingernails  -nausea  -muscle pain  -red flush to skin  -sweating  -weak or tired  This list may not describe all possible side effects. Call your doctor for medical advice about side effects. You may report side effects to FDA at 1-800-FDA-1088.  Where should I keep my medicine?  This drug is given in a hospital or clinic and will not be stored at home.  NOTE: This sheet is a summary. It may not cover all possible information. If you have questions about this medicine, talk to your doctor, pharmacist, or health care provider.      2016, [employer]. (2014-11-12 16:04:57)

## 2015-11-05 ENCOUNTER — Inpatient Hospital Stay: Payer: Medicare Other

## 2015-11-14 ENCOUNTER — Inpatient Hospital Stay: Payer: Medicare Other | Attending: Oncology

## 2015-11-14 ENCOUNTER — Inpatient Hospital Stay (HOSPITAL_BASED_OUTPATIENT_CLINIC_OR_DEPARTMENT_OTHER): Payer: Medicare Other | Admitting: Oncology

## 2015-11-14 ENCOUNTER — Inpatient Hospital Stay: Payer: Medicare Other

## 2015-11-14 ENCOUNTER — Encounter: Payer: Self-pay | Admitting: Oncology

## 2015-11-14 VITALS — BP 145/71 | HR 81 | Temp 97.6°F | Resp 18 | Wt 155.6 lb

## 2015-11-14 VITALS — BP 133/79 | HR 51 | Resp 16

## 2015-11-14 DIAGNOSIS — Z5111 Encounter for antineoplastic chemotherapy: Secondary | ICD-10-CM | POA: Insufficient documentation

## 2015-11-14 DIAGNOSIS — C7951 Secondary malignant neoplasm of bone: Secondary | ICD-10-CM | POA: Insufficient documentation

## 2015-11-14 DIAGNOSIS — Z87891 Personal history of nicotine dependence: Secondary | ICD-10-CM | POA: Insufficient documentation

## 2015-11-14 DIAGNOSIS — Z79899 Other long term (current) drug therapy: Secondary | ICD-10-CM | POA: Insufficient documentation

## 2015-11-14 DIAGNOSIS — R9721 Rising PSA following treatment for malignant neoplasm of prostate: Secondary | ICD-10-CM | POA: Insufficient documentation

## 2015-11-14 DIAGNOSIS — Z7689 Persons encountering health services in other specified circumstances: Secondary | ICD-10-CM | POA: Diagnosis not present

## 2015-11-14 DIAGNOSIS — C61 Malignant neoplasm of prostate: Secondary | ICD-10-CM

## 2015-11-14 DIAGNOSIS — C787 Secondary malignant neoplasm of liver and intrahepatic bile duct: Secondary | ICD-10-CM | POA: Insufficient documentation

## 2015-11-14 DIAGNOSIS — C801 Malignant (primary) neoplasm, unspecified: Secondary | ICD-10-CM

## 2015-11-14 DIAGNOSIS — E785 Hyperlipidemia, unspecified: Secondary | ICD-10-CM | POA: Insufficient documentation

## 2015-11-14 DIAGNOSIS — G479 Sleep disorder, unspecified: Secondary | ICD-10-CM | POA: Diagnosis not present

## 2015-11-14 LAB — COMPREHENSIVE METABOLIC PANEL
ALT: 13 U/L — AB (ref 17–63)
AST: 18 U/L (ref 15–41)
Albumin: 3.4 g/dL — ABNORMAL LOW (ref 3.5–5.0)
Alkaline Phosphatase: 122 U/L (ref 38–126)
Anion gap: 8 (ref 5–15)
BILIRUBIN TOTAL: 0.4 mg/dL (ref 0.3–1.2)
BUN: 13 mg/dL (ref 6–20)
CO2: 27 mmol/L (ref 22–32)
CREATININE: 1.05 mg/dL (ref 0.61–1.24)
Calcium: 8.4 mg/dL — ABNORMAL LOW (ref 8.9–10.3)
Chloride: 98 mmol/L — ABNORMAL LOW (ref 101–111)
Glucose, Bld: 105 mg/dL — ABNORMAL HIGH (ref 65–99)
POTASSIUM: 4.1 mmol/L (ref 3.5–5.1)
Sodium: 133 mmol/L — ABNORMAL LOW (ref 135–145)
TOTAL PROTEIN: 7.5 g/dL (ref 6.5–8.1)

## 2015-11-14 LAB — CBC WITH DIFFERENTIAL/PLATELET
BASOS ABS: 0 10*3/uL (ref 0–0.1)
Basophils Relative: 0 %
Eosinophils Absolute: 0.2 10*3/uL (ref 0–0.7)
Eosinophils Relative: 2 %
HEMATOCRIT: 30.6 % — AB (ref 40.0–52.0)
Hemoglobin: 10.1 g/dL — ABNORMAL LOW (ref 13.0–18.0)
LYMPHS ABS: 3.2 10*3/uL (ref 1.0–3.6)
LYMPHS PCT: 45 %
MCH: 28 pg (ref 26.0–34.0)
MCHC: 33 g/dL (ref 32.0–36.0)
MCV: 84.8 fL (ref 80.0–100.0)
MONO ABS: 0.8 10*3/uL (ref 0.2–1.0)
Monocytes Relative: 10 %
NEUTROS ABS: 3.1 10*3/uL (ref 1.4–6.5)
Neutrophils Relative %: 43 %
Platelets: 354 10*3/uL (ref 150–440)
RBC: 3.62 MIL/uL — ABNORMAL LOW (ref 4.40–5.90)
RDW: 14.3 % (ref 11.5–14.5)
WBC: 7.2 10*3/uL (ref 3.8–10.6)

## 2015-11-14 LAB — PSA: PSA: 391 ng/mL — AB (ref 0.00–4.00)

## 2015-11-14 MED ORDER — DIPHENHYDRAMINE HCL 50 MG/ML IJ SOLN
25.0000 mg | Freq: Once | INTRAMUSCULAR | Status: AC | PRN
  Administered 2015-11-14: 25 mg via INTRAVENOUS

## 2015-11-14 MED ORDER — HYDROCORTISONE NA SUCCINATE PF 100 MG IJ SOLR
200.0000 mg | Freq: Once | INTRAMUSCULAR | Status: AC
  Administered 2015-11-14: 200 mg via INTRAVENOUS

## 2015-11-14 MED ORDER — FOSAPREPITANT DIMEGLUMINE INJECTION 150 MG
Freq: Once | INTRAVENOUS | Status: AC
  Administered 2015-11-14: 10:00:00 via INTRAVENOUS
  Filled 2015-11-14: qty 5

## 2015-11-14 MED ORDER — FENTANYL 50 MCG/HR TD PT72: 50.0000 ug | MEDICATED_PATCH | TRANSDERMAL | Status: DC

## 2015-11-14 MED ORDER — PEGFILGRASTIM 6 MG/0.6ML ~~LOC~~ PSKT: 6.0000 mg | PREFILLED_SYRINGE | Freq: Once | SUBCUTANEOUS | Status: DC

## 2015-11-14 MED ORDER — OXYCODONE HCL 10 MG PO TABS: 10.0000 mg | ORAL_TABLET | Freq: Four times a day (QID) | ORAL | Status: DC | PRN

## 2015-11-14 MED ORDER — PALONOSETRON HCL INJECTION 0.25 MG/5ML
0.2500 mg | Freq: Once | INTRAVENOUS | Status: AC
  Administered 2015-11-14: 0.25 mg via INTRAVENOUS
  Filled 2015-11-14: qty 5

## 2015-11-14 MED ORDER — SODIUM CHLORIDE 0.9 % IV SOLN
Freq: Once | INTRAVENOUS | Status: AC
  Administered 2015-11-14: 09:00:00 via INTRAVENOUS
  Filled 2015-11-14: qty 1000

## 2015-11-14 MED ORDER — DOCETAXEL CHEMO INJECTION 160 MG/16ML
70.0000 mg/m2 | Freq: Once | INTRAVENOUS | Status: AC
  Administered 2015-11-14: 130 mg via INTRAVENOUS
  Filled 2015-11-14: qty 13

## 2015-11-14 MED ORDER — ONDANSETRON HCL 4 MG PO TABS: 4.0000 mg | ORAL_TABLET | Freq: Four times a day (QID) | ORAL | Status: DC | PRN

## 2015-11-14 NOTE — Progress Notes (Signed)
  Oncology Nurse Navigator Documentation  Navigator Location: [facility] (11/14/15 1100) Navigator Encounter Type: Treatment (11/14/15 1100)           Patient Visit Type: MedOnc (11/14/15 1100) Treatment Phase: First Chemo Tx (11/14/15 1100) Barriers/Navigation Needs: Financial (11/14/15 1100)                          Time Spent with Patient: 30 (11/14/15 1100)   Met with ex spouse regarding financial issues. Educated on resources available here at cancer center. Can assist with charitable funds and [employer]num

## 2015-11-14 NOTE — Patient Instructions (Signed)

## 2015-11-14 NOTE — Progress Notes (Signed)
[place] @ [facility] [phone]  Fax:([phone] [place]: [birth year]  MR#: [id number]  [id number]  Patient Care Team: Pcp Not In System as PCP - General  CHIEF COMPLAINT:  Chief Complaint  Patient presents with  . Prostate Cancer    Oncology History   1.MRI scan on August 4 of 2015  revealed in your able liver lesion and multiple areas  of   abnormallity  in the bone suggestive off metastases  2.High PSA. 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  Patient has been started on [facility] (August, 2015) and Delton See, 4.patient started on [facility] protocol with Gillermina Phy nd ZYTIGA (May, 2016)     Prostate cancer metastatic to multiple sites [facility])   03/11/2015 Initial Diagnosis Prostate cancer metastatic to multiple sites    Oncology Flowsheet 05/09/2015 06/06/2015 07/04/2015 08/01/2015 08/29/2015 09/26/2015 10/24/2015  degarelix (FIRMAGON) [place] 80 mg 80 mg 80 mg 80 mg 80 mg 80 mg 80 mg  denosumab (XGEVA) [place] 120 mg 120 mg 120 mg 120 mg 120 mg 120 mg 120 mg   5.  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    Patient has progressing disease.  Appetite has been poor.  Right upper quadrant pain.  Losing weight.  No nausea.  No vomiting.  Pain is constant dull aching patient is on fentanyl patch and oxycodone.  Patient is here to initiate Taxotere chemotherapy REVIEW OF SYSTEMS:   ROS GENERAL:  Feels good.  Active.  Appetite is poor and losing weight PERFORMANCE STATUS (ECOG):  01 HEENT:  No visual changes, runny nose, sore throat, mouth sores or tenderness. Lungs: No shortness of breath or cough.  No hemoptysis. Cardiac:  No chest pain, palpitations,  orthopnea, or PND. GI: patient has increasing pain in the right upper quadrant. GU:  No urgency, frequency, dysuria, or hematuria. Musculoskeletal:  No back pain.  No joint pain.  No muscle tenderness. Extremities:  No pain or swelling. Skin:  No rashes or skin changes. Neuro:  No headache, numbness or weakness, balance or coordination issues. Endocrine:  No diabetes, thyroid issues, hot flashes or night sweats. Psych:  No mood changes, depression or anxiety. Pain:  No focal pain. Review of systems:  All other systems reviewed and found to be negative. As per HPI. Otherwise, a complete review of systems is negatve.  PAST MEDICAL HISTORY: Past Medical History  Diagnosis Date  . Hyperlipidemia   . Prostate cancer ([place])     2014  . Bone cancer ([place])     PAST SURGICAL HISTORY: Past Surgical History  Procedure Laterality Date  . Ankle surgery Right     FAMILY HISTORY There is no significant family history of breast cancer, ovarian cancer, colon cancer    HEALTH MAINTENANCE: Social History  Substance Use Topics  . Smoking status: Former Smoker -- 0.50 packs/day for 45 years    Types: Cigarettes    Quit date: 11/13/1991  . Smokeless tobacco: None  . Alcohol Use: 29.4 oz/week    6 Cans of beer, 43 Shots of liquor per week      Allergies  Allergen Reactions  . No Known Allergies      OBJECTIVE: Filed  Vitals:   11/14/15 0831  BP: 145/71  Pulse: 81  Temp: 97.6 F (36.4 C)  Resp: 18     Body mass index is 22.33 kg/(m^2).    ECOG FS:1 - Symptomatic but completely ambulatory  Physical Exam  General  status: Performance status is good.  Patient has not lost significant weight HEENT: No evidence of stomatitis. Sclera and conjunctivae :: No jaundice.   pale looking. Lungs: Air  entry equal on both sides.  No rhonchi.  No rales.  Cardiac: Heart sounds are normal.  No pericardial rub.  No murmur. Lymphatic system: Cervical, axillary, inguinal, lymph nodes not  palpable GI: Abdomen is soft.  No ascites.  Liver spleen not palpable.  No tenderness.  Bowel sounds are within normal limit Lower extremity: No edema Neurological system: Higher functions, cranial nerves intact no evidence of peripheral neuropathy. Skin: No rash.  No ecchymosis... LAB RESULTS:          No results found for: SPEP, UPEP  Lab Results  Component Value Date   WBC 7.2 11/14/2015   NEUTROABS 3.1 11/14/2015   HGB 10.1* 11/14/2015   HCT 30.6* 11/14/2015   MCV 84.8 11/14/2015   PLT 354 11/14/2015      Chemistry     STUDIES: Ct Chest W Contrast  10/23/2015  CLINICAL DATA:  Prostate cancer with bone metastasis. RECIST 1.1 protocol EXAM: CT CHEST, ABDOMEN, AND PELVIS WITH CONTRAST TECHNIQUE: Multidetector CT imaging of the chest, abdomen and pelvis was performed following the standard protocol during bolus administration of intravenous contrast. CONTRAST:  147mL OMNIPAQUE IOHEXOL 300 MG/ML  SOLN COMPARISON:  CT 08/26/2015 RECIST 1.1 Target Lesions: 1. Right hepatic lobe pericholecystic lesion, 1.3 cm (series 2, image 67), previously 1.3 cm (corrected from previous 1.6 cm measurement) 2. LEFT Hepatic lobe lesion adjacent gallbladder fossa measures 1.4 cm (image 63, series 2) compared to 1.6 cm. Non-target Lesions: 1. Left lower lobe pulmonary nodule, 5 mm (series 4, image 40), stable. 2. Gastrohepatic ligament lymph node, 12 mm (series 2, image 57), Stable. 3. Left pelvic sidewall lymph node, 4 mm (series 2, image 97), previously 5 mm. FINDINGS: CT CHEST FINDINGS CT CHEST FINDINGS Mediastinum/Nodes: No mediastinal hilar lymphadenopathy. No axillary or supraclavicular adenopathy. No pericardial fluid. No central pulmonary embolism. Lungs/Pleura: 5 mm nodule in the LEFT lower lobe on image 40, series is unchanged. 3 mm nodule in the LEFT lower lobe on image 31 is unchanged. 4 mm nodule in the RIGHT lower lobe on image 31 is more prominent than 3 mm nodule on prior. No new nodules.  Musculoskeletal: No aggressive osseous lesion. CT ABDOMEN AND PELVIS FINDINGS Hepatobiliary: Marked increase in size of rounded lesion in the superior aspect the RIGHT hepatic lobe measuring 7.2 x 8.0 cm (image 48, series 2) increased from 3.1 by 2.9 cm on prior. The subcapsular lesion the anterior LEFT hepatic lobe measures 3.4 cm unchanged from 3.6 cm (image 59, series 2. The target lesions for RECIST criteria are unchanged.  See above. Pancreas: Pancreas is normal. No ductal dilatation. No pancreatic inflammation. Spleen: Normal spleen Adrenals/urinary tract: Adrenal glands are normal. Stable small simple fluid attenuation cortical lesion the LEFT kidney. Ureters and bladder normal. Stomach/Bowel: Stomach, small bowel, appendix, and cecum are normal. The colon and rectosigmoid colon are normal. Vascular/Lymphatic: Abdominal aorta is normal caliber with atherosclerotic calcification. There is no retroperitoneal or periportal lymphadenopathy. No pelvic lymphadenopathy. Small gastrohepatic ligament and periportal lymph nodes are not changed size. Example 12 mm gastrohepatic ligament lymph  node on image 57, series 2. 13 mm short axis periportal lymph node on image 60. Reproductive: Prostate normal Other: No peritoneal disease. Musculoskeletal: Widespread sclerotic skeletal metastasis. The sclerotic metastasis is nearly completed in the axillary skeleton and patchy in the proximal appendicular skeleton. IMPRESSION: Chest Impression: 1. Stable LEFT lower lobe pulmonary nodules. One RIGHT lower lobe pulmonary nodule is more prominent. Recommend attention on follow-up. 2. No mediastinal adenopathy. Abdomen / Pelvis Impression: 1. 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 2. Other hepatic lesions are stable including RECIST target lesions. 3. Stable skeletal metastasis. Electronically Signed   By: Suzy Bouchard M.D.   On: 10/23/2015 11:44   Nm Bone Scan Whole Body  10/22/2015  CLINICAL DATA:   Prostate cancer with skeletal metastasis. Multiple fractures from prior trauma. Research protocol.[facility] [id number] research protocol EXAM: NUCLEAR MEDICINE WHOLE BODY BONE SCAN TECHNIQUE: Whole body anterior and posterior images were obtained approximately 3 hours after intravenous injection of radiopharmaceutical. RADIOPHARMACEUTICALS:  23.27 mCi Technetium-53m MDP IV COMPARISON:  08/28/2015, baseline 07/04/2015 PCWG2 Measurements for Bone Metastasis: 1. No evidence of new skeletal lesions. 2. Widespread skeletal metastasis unchanged from baseline exam of 02/01/2015. FINDINGS: There are multiple sites of abnormal uptake within the thoracic and lumbar spine as well as the pelvis and proximal femurs which are not changed from prior. Uptake in the RIGHT shoulder is unchanged. Mottled uptake within the posterior rib is unchanged. Uptake in the anterior lateral fourth rib on the LEFT is also unchanged. No evidence of new sites of metastatic disease. IMPRESSION: Multiple sites of skeletal metastasis within the ribs, spine, pelvis, and hand proximal femurs are not changed from comparison exam of 08/28/2015. Stable over additional exams. Electronically Signed   By: Suzy Bouchard M.D.   On: 10/22/2015 14:32   Ct Abdomen Pelvis W Contrast  10/23/2015  CLINICAL DATA:  Prostate cancer with bone metastasis. RECIST 1.1 protocol EXAM: CT CHEST, ABDOMEN, AND PELVIS WITH CONTRAST TECHNIQUE: Multidetector CT imaging of the chest, abdomen and pelvis was performed following the standard protocol during bolus administration of intravenous contrast. CONTRAST:  132mL OMNIPAQUE IOHEXOL 300 MG/ML  SOLN COMPARISON:  CT 08/26/2015 RECIST 1.1 Target Lesions: 1. Right hepatic lobe pericholecystic lesion, 1.3 cm (series 2, image 67), previously 1.3 cm (corrected from previous 1.6 cm measurement) 2. LEFT Hepatic lobe lesion adjacent gallbladder fossa measures 1.4 cm (image 63, series 2) compared to 1.6 cm. Non-target Lesions: 1. Left  lower lobe pulmonary nodule, 5 mm (series 4, image 40), stable. 2. Gastrohepatic ligament lymph node, 12 mm (series 2, image 57), Stable. 3. Left pelvic sidewall lymph node, 4 mm (series 2, image 97), previously 5 mm. FINDINGS: CT CHEST FINDINGS CT CHEST FINDINGS Mediastinum/Nodes: No mediastinal hilar lymphadenopathy. No axillary or supraclavicular adenopathy. No pericardial fluid. No central pulmonary embolism. Lungs/Pleura: 5 mm nodule in the LEFT lower lobe on image 40, series is unchanged. 3 mm nodule in the LEFT lower lobe on image 31 is unchanged. 4 mm nodule in the RIGHT lower lobe on image 31 is more prominent than 3 mm nodule on prior. No new nodules. Musculoskeletal: No aggressive osseous lesion. CT ABDOMEN AND PELVIS FINDINGS Hepatobiliary: Marked increase in size of rounded lesion in the superior aspect the RIGHT hepatic lobe measuring 7.2 x 8.0 cm (image 48, series 2) increased from 3.1 by 2.9 cm on prior. The subcapsular lesion the anterior LEFT hepatic lobe measures 3.4 cm unchanged from 3.6 cm (image 59, series 2. The target lesions for RECIST criteria  are unchanged.  See above. Pancreas: Pancreas is normal. No ductal dilatation. No pancreatic inflammation. Spleen: Normal spleen Adrenals/urinary tract: Adrenal glands are normal. Stable small simple fluid attenuation cortical lesion the LEFT kidney. Ureters and bladder normal. Stomach/Bowel: Stomach, small bowel, appendix, and cecum are normal. The colon and rectosigmoid colon are normal. Vascular/Lymphatic: Abdominal aorta is normal caliber with atherosclerotic calcification. There is no retroperitoneal or periportal lymphadenopathy. No pelvic lymphadenopathy. Small gastrohepatic ligament and periportal lymph nodes are not changed size. Example 12 mm gastrohepatic ligament lymph node on image 57, series 2. 13 mm short axis periportal lymph node on image 60. Reproductive: Prostate normal Other: No peritoneal disease. Musculoskeletal: Widespread  sclerotic skeletal metastasis. The sclerotic metastasis is nearly completed in the axillary skeleton and patchy in the proximal appendicular skeleton. IMPRESSION: Chest Impression: 1. Stable LEFT lower lobe pulmonary nodules. One RIGHT lower lobe pulmonary nodule is more prominent. Recommend attention on follow-up. 2. No mediastinal adenopathy. Abdomen / Pelvis Impression: 1. 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 2. Other hepatic lesions are stable including RECIST target lesions. 3. Stable skeletal metastasis. Electronically Signed   By: Suzy Bouchard M.D.   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 Progressing disease by the criteria of PSA which is rising as well as by CT scan of abdomen showing large hepatic lesion which is increasing in size.  Patient has been taken off protocol.  There was some question about protocol   measurements   Patient has been discussed   with radiologist.   PSA has jumped up to 224 from previous value soap 64 patient's clinically having increasing pain suggestive of progressive disease.     Proceed with chemotherapy with Taxotere.   Intent of chemotherapy is palliation and relief in symptoms and extending survival Patient was explained all the side effects of chemotherapy.  And informed consent has been obtained. Total duration of visit was 45 minutes.  50% or more time was spent in counseling patient and family regarding prognosis and options of treatment and available resources   Port placement has been arranged.  With vascular surgeon  Pain management Continue fentanyl patch and oxycodone.  Because of patient's old age and increased chance of neutropenia will proceed with prophylactic Neulasta     Staging form: Prostate, AJCC 7th Edition     Clinical: Stage IV (T3, N0, M1b, PSA: 20 or greater, Gleason 8-10 - Poorly differentiated/undifferentiated (marked anaplasia))  - Signed by Forest Gleason, MD on 03/17/2015   Forest Gleason, MD   11/14/2015 8:56 AM          Smoking History: Smoking History 1(1)Packs per day and Quit 1993 after 30 yr history.(1)                         )     Advance Directive:  Advance Directive ([place]) yes(1)   Do you want to revise or change your advance directive? No(1)          PAST MEDICAL HISTORY: Past Medical History  Diagnosis Date  . Hyperlipidemia   . Prostate cancer ([place])     2014  . Bone cancer ([place])     PAST SURGICAL HISTORY: Past Surgical History  Procedure Laterality Date  . Ankle surgery Right     FAMILY HISTORY There is no significant family history of breast cancer, ovarian cancer, colon cancer    HEALTH  MAINTENANCE: Social History  Substance Use Topics  . Smoking status: Former Smoker -- 0.50 packs/day for 45 years    Types: Cigarettes    Quit date: 11/13/1991  . Smokeless tobacco: None  . Alcohol Use: 29.4 oz/week    6 Cans of beer, 43 Shots of liquor per week      Allergies  Allergen Reactions  . No Known Allergies      OBJECTIVE: Filed Vitals:   11/14/15 0831  BP: 145/71  Pulse: 81  Temp: 97.6 F (36.4 C)  Resp: 18     Body mass index is 22.33 kg/(m^2).    ECOG FS:1 - Symptomatic but completely ambulatory  Physical Exam  General  status: Performance status is good.  Patient has not lost significant weight HEENT: No evidence of stomatitis. Sclera and conjunctivae :: No jaundice.   pale looking. Lungs: Air  entry equal on both sides.  No rhonchi.  No rales.  Cardiac: Heart sounds are normal.  No pericardial rub.  No murmur. Lymphatic system: Cervical, axillary, inguinal, lymph nodes not palpable GI: Abdomen is soft.  No ascites.  Liver spleen not palpable.  No tenderness.  Bowel sounds are within normal limit Lower extremity: No edema Neurological system: Higher functions, cranial nerves intact no evidence of peripheral  neuropathy. Skin: No rash.  No ecchymosis... LAB RESULTS: Component     Latest Ref Rng 02/14/2015 03/13/2015 04/11/2015 05/09/2015 06/06/2015  PSA     0.00 - 4.00 ng/mL 229.0 (H) 311.00 (H) 44.81 (H) 42.96 (H) 41.44 (H)   Component     Latest Ref Rng 07/04/2015  PSA     0.00 - 4.00 ng/mL 32.59 (H)   Component     Latest Ref Rng 06/06/2015 07/04/2015 08/01/2015 08/29/2015  PSA     0.00 - 4.00 ng/mL 41.44 (H) 32.59 (H) 33.18 (H) 45.99 (H)   4d ago (09/26/15) 49mo ago (08/29/15) 85mo ago (08/01/15) 41mo ago (07/04/15)    PSA 0.00 - 4.00 ng/mL 64.70 (H)            Component Value Date/Time   NA 133* 11/14/2015 0808   NA 129* 02/22/2015 0932   K 4.1 11/14/2015 0808   K 4.4 02/22/2015 0932   CL 98* 11/14/2015 0808   CL 96* 02/22/2015 0932   CO2 27 11/14/2015 0808   CO2 26 02/22/2015 0932   GLUCOSE 105* 11/14/2015 0808   GLUCOSE 111* 02/22/2015 0932   BUN 13 11/14/2015 0808   BUN 25* 02/22/2015 0932   CREATININE 1.05 11/14/2015 0808   CREATININE 0.96 02/22/2015 0932   CALCIUM 8.4* 11/14/2015 0808   CALCIUM 7.7* 02/22/2015 0932   PROT 7.5 11/14/2015 0808   PROT 8.0 02/22/2015 0932   ALBUMIN 3.4* 11/14/2015 0808   ALBUMIN 3.3* 02/22/2015 0932   AST 18 11/14/2015 0808   AST 65* 02/22/2015 0932   ALT 13* 11/14/2015 0808   ALT 63 02/22/2015 0932   ALKPHOS 122 11/14/2015 0808   ALKPHOS 256* 02/22/2015 0932   BILITOT 0.4 11/14/2015 0808   BILITOT 1.0 02/22/2015 0932   GFRNONAA >60 11/14/2015 0808   GFRNONAA >60 02/22/2015 0932   GFRNONAA >60 11/20/2014 0940   GFRAA >60 11/14/2015 0808   GFRAA >60 02/22/2015 0932   GFRAA >60 11/20/2014 0940    No results found for: SPEP, UPEP  Lab Results  Component Value Date   WBC 7.2 11/14/2015   NEUTROABS 3.1 11/14/2015   HGB 10.1* 11/14/2015   HCT 30.6* 11/14/2015   MCV  84.8 11/14/2015   PLT 354 11/14/2015      Chemistry     STUDIES: Ct Chest W Contrast  10/23/2015  CLINICAL DATA:  Prostate cancer with bone metastasis. RECIST  1.1 protocol EXAM: CT CHEST, ABDOMEN, AND PELVIS WITH CONTRAST TECHNIQUE: Multidetector CT imaging of the chest, abdomen and pelvis was performed following the standard protocol during bolus administration of intravenous contrast. CONTRAST:  155mL OMNIPAQUE IOHEXOL 300 MG/ML  SOLN COMPARISON:  CT 08/26/2015 RECIST 1.1 Target Lesions: 1. Right hepatic lobe pericholecystic lesion, 1.3 cm (series 2, image 67), previously 1.3 cm (corrected from previous 1.6 cm measurement) 2. LEFT Hepatic lobe lesion adjacent gallbladder fossa measures 1.4 cm (image 63, series 2) compared to 1.6 cm. Non-target Lesions: 1. Left lower lobe pulmonary nodule, 5 mm (series 4, image 40), stable. 2. Gastrohepatic ligament lymph node, 12 mm (series 2, image 57), Stable. 3. Left pelvic sidewall lymph node, 4 mm (series 2, image 97), previously 5 mm. FINDINGS: CT CHEST FINDINGS CT CHEST FINDINGS Mediastinum/Nodes: No mediastinal hilar lymphadenopathy. No axillary or supraclavicular adenopathy. No pericardial fluid. No central pulmonary embolism. Lungs/Pleura: 5 mm nodule in the LEFT lower lobe on image 40, series is unchanged. 3 mm nodule in the LEFT lower lobe on image 31 is unchanged. 4 mm nodule in the RIGHT lower lobe on image 31 is more prominent than 3 mm nodule on prior. No new nodules. Musculoskeletal: No aggressive osseous lesion. CT ABDOMEN AND PELVIS FINDINGS Hepatobiliary: Marked increase in size of rounded lesion in the superior aspect the RIGHT hepatic lobe measuring 7.2 x 8.0 cm (image 48, series 2) increased from 3.1 by 2.9 cm on prior. The subcapsular lesion the anterior LEFT hepatic lobe measures 3.4 cm unchanged from 3.6 cm (image 59, series 2. The target lesions for RECIST criteria are unchanged.  See above. Pancreas: Pancreas is normal. No ductal dilatation. No pancreatic inflammation. Spleen: Normal spleen Adrenals/urinary tract: Adrenal glands are normal. Stable small simple fluid attenuation cortical lesion the LEFT  kidney. Ureters and bladder normal. Stomach/Bowel: Stomach, small bowel, appendix, and cecum are normal. The colon and rectosigmoid colon are normal. Vascular/Lymphatic: Abdominal aorta is normal caliber with atherosclerotic calcification. There is no retroperitoneal or periportal lymphadenopathy. No pelvic lymphadenopathy. Small gastrohepatic ligament and periportal lymph nodes are not changed size. Example 12 mm gastrohepatic ligament lymph node on image 57, series 2. 13 mm short axis periportal lymph node on image 60. Reproductive: Prostate normal Other: No peritoneal disease. Musculoskeletal: Widespread sclerotic skeletal metastasis. The sclerotic metastasis is nearly completed in the axillary skeleton and patchy in the proximal appendicular skeleton. IMPRESSION: Chest Impression: 1. Stable LEFT lower lobe pulmonary nodules. One RIGHT lower lobe pulmonary nodule is more prominent. Recommend attention on follow-up. 2. No mediastinal adenopathy. Abdomen / Pelvis Impression: 1. 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 2. Other hepatic lesions are stable including RECIST target lesions. 3. Stable skeletal metastasis. Electronically Signed   By: Suzy Bouchard M.D.   On: 10/23/2015 11:44   Nm Bone Scan Whole Body  10/22/2015  CLINICAL DATA:  Prostate cancer with skeletal metastasis. Multiple fractures from prior trauma. Research protocol.[facility] [id number] research protocol EXAM: NUCLEAR MEDICINE WHOLE BODY BONE SCAN TECHNIQUE: Whole body anterior and posterior images were obtained approximately 3 hours after intravenous injection of radiopharmaceutical. RADIOPHARMACEUTICALS:  23.27 mCi Technetium-33m MDP IV COMPARISON:  08/28/2015, baseline 07/04/2015 PCWG2 Measurements for Bone Metastasis: 1. No evidence of new skeletal lesions. 2. Widespread skeletal metastasis unchanged from baseline exam of 02/01/2015.  FINDINGS: There are multiple sites of abnormal uptake within the thoracic and lumbar  spine as well as the pelvis and proximal femurs which are not changed from prior. Uptake in the RIGHT shoulder is unchanged. Mottled uptake within the posterior rib is unchanged. Uptake in the anterior lateral fourth rib on the LEFT is also unchanged. No evidence of new sites of metastatic disease. IMPRESSION: Multiple sites of skeletal metastasis within the ribs, spine, pelvis, and hand proximal femurs are not changed from comparison exam of 08/28/2015. Stable over additional exams. Electronically Signed   By: Suzy Bouchard M.D.   On: 10/22/2015 14:32   Ct Abdomen Pelvis W Contrast  10/23/2015  CLINICAL DATA:  Prostate cancer with bone metastasis. RECIST 1.1 protocol EXAM: CT CHEST, ABDOMEN, AND PELVIS WITH CONTRAST TECHNIQUE: Multidetector CT imaging of the chest, abdomen and pelvis was performed following the standard protocol during bolus administration of intravenous contrast. CONTRAST:  163mL OMNIPAQUE IOHEXOL 300 MG/ML  SOLN COMPARISON:  CT 08/26/2015 RECIST 1.1 Target Lesions: 1. Right hepatic lobe pericholecystic lesion, 1.3 cm (series 2, image 67), previously 1.3 cm (corrected from previous 1.6 cm measurement) 2. LEFT Hepatic lobe lesion adjacent gallbladder fossa measures 1.4 cm (image 63, series 2) compared to 1.6 cm. Non-target Lesions: 1. Left lower lobe pulmonary nodule, 5 mm (series 4, image 40), stable. 2. Gastrohepatic ligament lymph node, 12 mm (series 2, image 57), Stable. 3. Left pelvic sidewall lymph node, 4 mm (series 2, image 97), previously 5 mm. FINDINGS: CT CHEST FINDINGS CT CHEST FINDINGS Mediastinum/Nodes: No mediastinal hilar lymphadenopathy. No axillary or supraclavicular adenopathy. No pericardial fluid. No central pulmonary embolism. Lungs/Pleura: 5 mm nodule in the LEFT lower lobe on image 40, series is unchanged. 3 mm nodule in the LEFT lower lobe on image 31 is unchanged. 4 mm nodule in the RIGHT lower lobe on image 31 is more prominent than 3 mm nodule on prior. No new  nodules. Musculoskeletal: No aggressive osseous lesion. CT ABDOMEN AND PELVIS FINDINGS Hepatobiliary: Marked increase in size of rounded lesion in the superior aspect the RIGHT hepatic lobe measuring 7.2 x 8.0 cm (image 48, series 2) increased from 3.1 by 2.9 cm on prior. The subcapsular lesion the anterior LEFT hepatic lobe measures 3.4 cm unchanged from 3.6 cm (image 59, series 2. The target lesions for RECIST criteria are unchanged.  See above. Pancreas: Pancreas is normal. No ductal dilatation. No pancreatic inflammation. Spleen: Normal spleen Adrenals/urinary tract: Adrenal glands are normal. Stable small simple fluid attenuation cortical lesion the LEFT kidney. Ureters and bladder normal. Stomach/Bowel: Stomach, small bowel, appendix, and cecum are normal. The colon and rectosigmoid colon are normal. Vascular/Lymphatic: Abdominal aorta is normal caliber with atherosclerotic calcification. There is no retroperitoneal or periportal lymphadenopathy. No pelvic lymphadenopathy. Small gastrohepatic ligament and periportal lymph nodes are not changed size. Example 12 mm gastrohepatic ligament lymph node on image 57, series 2. 13 mm short axis periportal lymph node on image 60. Reproductive: Prostate normal Other: No peritoneal disease. Musculoskeletal: Widespread sclerotic skeletal metastasis. The sclerotic metastasis is nearly completed in the axillary skeleton and patchy in the proximal appendicular skeleton. IMPRESSION: Chest Impression: 1. Stable LEFT lower lobe pulmonary nodules. One RIGHT lower lobe pulmonary nodule is more prominent. Recommend attention on follow-up. 2. No mediastinal adenopathy. Abdomen / Pelvis Impression: 1. 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 2. Other hepatic lesions are stable including RECIST target lesions. 3. Stable skeletal metastasis. Electronically Signed   By: Helane Gunther.D.  On: 10/23/2015 11:44    ASSESSMENT:  Stage IV carcinoma prostate.   Castration resistant prostate cancer .  Patient is progressing based on CT scan and a PSA. CT scan has been reviewed independently and reviewed with the patient and family. Need for pain medication has been increased We will continue fentanyl patch and oxycodone for pain control  I also had discussion regarding protocol and by protocol criteria.  Will take patient off protocol at present time  Patient had been randomized to get both XTANDI and ZYTIGA  Possibility of starting chemotherapy with Taxotere has been discussed.  Intent of chemotherapy is palliation and relief in symptoms and extending survival  Patient is agreeable to it and will start patient on Taxotere Patient will attend chemotherapy class  Also discussed possibility of port placement  Total duration of visit was 45 minutes.  50% or more time was spent in counseling patient and family regarding prognosis .  Progressive disease.   Prostate cancer metastatic to multiple sites   Staging form: Prostate, AJCC 7th Edition     Clinical: Stage IV (T3, N0, M1b, PSA: 20 or greater, Gleason 8-10 - Poorly differentiated/undifferentiated (marked anaplasia)) - Signed by Forest Gleason, MD on 03/17/2015   Forest Gleason, MD   11/14/2015 8:56 AM                                   )

## 2015-11-14 NOTE — Progress Notes (Signed)
  1st time Taxotere tx. Started at 50 at 1020, pt started c/o difficulty breathing at 1035. Taxotere stopped, dr Oliva Bustard notified. Sat in 80's placed on oxygen at 2l/m. 31 Dr Oliva Bustard here. 1043 sat up to 99 Then up to 100. Dr Oliva Bustard gave verbal order to watch and when pt can keep sat up on room air, give Solu cortef 200mg  and restart taxotere at 11ml/hr and leave at that rate until complete. 1058 sat 99. 1102 oxygen off sat 100. 1210 SoluCortef given. 1225 Taxotere restarted sat 99. 1450 Taxotere infusion complete without any further problems. See MAR for meds and see VS flowsheet for VS. ju

## 2015-11-15 ENCOUNTER — Inpatient Hospital Stay: Payer: Medicare Other

## 2015-11-15 DIAGNOSIS — Z5111 Encounter for antineoplastic chemotherapy: Secondary | ICD-10-CM | POA: Diagnosis not present

## 2015-11-15 DIAGNOSIS — C61 Malignant neoplasm of prostate: Secondary | ICD-10-CM

## 2015-11-15 MED ORDER — PEGFILGRASTIM 6 MG/0.6ML ~~LOC~~ PSKT: 6.0000 mg | PREFILLED_SYRINGE | Freq: Once | SUBCUTANEOUS | Status: DC

## 2015-11-15 MED ORDER — PEGFILGRASTIM 6 MG/0.6ML ~~LOC~~ PSKT
6.0000 mg | PREFILLED_SYRINGE | Freq: Once | SUBCUTANEOUS | Status: AC
  Administered 2015-11-15: 6 mg via SUBCUTANEOUS
  Filled 2015-11-15: qty 0.6

## 2015-11-21 ENCOUNTER — Inpatient Hospital Stay: Payer: Medicare Other

## 2015-11-22 ENCOUNTER — Telehealth: Payer: Self-pay | Admitting: Oncology

## 2015-11-22 ENCOUNTER — Other Ambulatory Visit: Payer: Self-pay | Admitting: Vascular Surgery

## 2015-11-22 NOTE — Telephone Encounter (Signed)
Patient missed lab/xgeva firmagon inj appt yesterday (11/21/15) and isn't sure about r/s because of his port placement scheduled for next week. Please advise.

## 2015-11-22 NOTE — Telephone Encounter (Signed)
Reschedule patient at next convenient date for him.

## 2015-11-26 ENCOUNTER — Encounter
Admission: RE | Disposition: A | Discharge status: Home / Self Care | Payer: Self-pay | Source: Ambulatory Visit | Attending: Vascular Surgery

## 2015-11-26 ENCOUNTER — Ambulatory Visit
Admission: RE | Admit: 2015-11-26 | Discharge: 2015-11-26 | Disposition: A | Discharge status: Home / Self Care | Payer: Medicare Other | Source: Ambulatory Visit | Attending: Vascular Surgery | Admitting: Vascular Surgery

## 2015-11-26 ENCOUNTER — Other Ambulatory Visit: Payer: Self-pay | Admitting: *Deleted

## 2015-11-26 DIAGNOSIS — Z87891 Personal history of nicotine dependence: Secondary | ICD-10-CM | POA: Insufficient documentation

## 2015-11-26 DIAGNOSIS — Z79899 Other long term (current) drug therapy: Secondary | ICD-10-CM | POA: Diagnosis not present

## 2015-11-26 DIAGNOSIS — C61 Malignant neoplasm of prostate: Secondary | ICD-10-CM | POA: Insufficient documentation

## 2015-11-26 HISTORY — PX: PERIPHERAL VASCULAR CATHETERIZATION: SHX172C

## 2015-11-26 SURGERY — PORTA CATH INSERTION
Anesthesia: Moderate Sedation

## 2015-11-26 MED ORDER — LIDOCAINE-PRILOCAINE 2.5-2.5 % EX CREA: TOPICAL_CREAM | CUTANEOUS | Status: DC

## 2015-11-26 MED ORDER — METHYLPREDNISOLONE SODIUM SUCC 125 MG IJ SOLR: 125.0000 mg | INTRAMUSCULAR | Status: DC | PRN

## 2015-11-26 MED ORDER — ATROPINE SULFATE 0.1 MG/ML IJ SOLN: 0.5000 mg | Freq: Once | INTRAMUSCULAR | Status: DC | PRN

## 2015-11-26 MED ORDER — HEPARIN (PORCINE) IN NACL 2-0.9 UNIT/ML-% IJ SOLN
INTRAMUSCULAR | Status: AC
  Filled 2015-11-26: qty 1000

## 2015-11-26 MED ORDER — LIDOCAINE-EPINEPHRINE (PF) 1 %-1:200000 IJ SOLN
INTRAMUSCULAR | Status: AC
  Filled 2015-11-26: qty 30

## 2015-11-26 MED ORDER — HYDROMORPHONE HCL 1 MG/ML IJ SOLN: 1.0000 mg | Freq: Once | INTRAMUSCULAR | Status: DC

## 2015-11-26 MED ORDER — DEXTROSE 5 % IV SOLN
1.5000 g | INTRAVENOUS | Status: AC
  Administered 2015-11-26: 1.5 g via INTRAVENOUS

## 2015-11-26 MED ORDER — SODIUM CHLORIDE 0.9 % IV SOLN
INTRAVENOUS | Status: DC
  Administered 2015-11-26 (×2): via INTRAVENOUS

## 2015-11-26 MED ORDER — FENTANYL CITRATE (PF) 100 MCG/2ML IJ SOLN
INTRAMUSCULAR | Status: DC | PRN
  Administered 2015-11-26 (×2): 50 ug via INTRAVENOUS

## 2015-11-26 MED ORDER — FENTANYL CITRATE (PF) 100 MCG/2ML IJ SOLN
INTRAMUSCULAR | Status: AC
  Filled 2015-11-26: qty 2

## 2015-11-26 MED ORDER — ONDANSETRON HCL 4 MG/2ML IJ SOLN: 4.0000 mg | Freq: Four times a day (QID) | INTRAMUSCULAR | Status: DC | PRN

## 2015-11-26 MED ORDER — LIDOCAINE-EPINEPHRINE (PF) 1 %-1:200000 IJ SOLN
INTRAMUSCULAR | Status: DC | PRN
  Administered 2015-11-26: 10 mL via INTRADERMAL

## 2015-11-26 MED ORDER — MIDAZOLAM HCL 5 MG/5ML IJ SOLN
INTRAMUSCULAR | Status: AC
  Filled 2015-11-26: qty 5

## 2015-11-26 MED ORDER — MIDAZOLAM HCL 2 MG/2ML IJ SOLN
INTRAMUSCULAR | Status: DC | PRN
  Administered 2015-11-26: 2 mg via INTRAVENOUS
  Administered 2015-11-26: 1 mg via INTRAVENOUS

## 2015-11-26 SURGICAL SUPPLY — 9 items
BAG DECANTER STRL (MISCELLANEOUS) ×3 IMPLANT
DRAPE INCISE IOBAN 66X45 STRL (DRAPES) ×3 IMPLANT
PACK ANGIOGRAPHY (CUSTOM PROCEDURE TRAY) ×3 IMPLANT
PORTACATH POWER 8F (Port) ×3 IMPLANT
PREP CHG 10.5 TEAL (MISCELLANEOUS) ×3 IMPLANT
SUT MNCRL AB 4-0 PS2 18 (SUTURE) ×3 IMPLANT
SUT PROLENE 0 CT 1 30 (SUTURE) ×3 IMPLANT
SUTURE VIC 3-0 (SUTURE) ×3 IMPLANT
TOWEL OR 17X26 4PK STRL BLUE (TOWEL DISPOSABLE) ×3 IMPLANT

## 2015-11-26 NOTE — Op Note (Signed)
OPERATIVE NOTE   PROCEDURE: 1. Placement of a right IJ Infuse-a-Port  PRE-OPERATIVE DIAGNOSIS: Metastatic prostate cancer  POST-OPERATIVE DIAGNOSIS: Same  SURGEON: Katha Cabal M.D.  ANESTHESIA:  Conscious sedation was administered under my direct supervision. IV Versed plus fentanyl were utilized. Continuous ECG, pulse oximetry and blood pressure was monitored throughout the entire procedure. A total of 3 milligrams of Versed and 100 micrograms of fentanyl were utilized.  Conscious sedation was begun at  11:05 AM and concluded at 11:35 AM for a total of 30 minutes.   ESTIMATED BLOOD LOSS: Minimal   FINDING(S): 1.  Patent vein  SPECIMEN(S): None  INDICATIONS:   Sean Wolfe is a 75 y.o. male who presents with metastatic prostate cancer, he will require chemotherapy and therefore requires appropriate intravenous access..  DESCRIPTION: After obtaining full informed written consent, the patient was brought back to the special procedure suite and placed in the supine position. The patient's right neck and chest wall are prepped and draped in sterile fashion. Appropriate timeout was called.  Ultrasound is placed in a sterile sleeve, ultrasound is utilized to avoid vascular injury as well as secondary to lack of appropriate landmarks. The right internal jugular vein is identified. It is echolucent and homogeneous as well as easily compressible indicating patency. 1% lidocaine is infiltrated into the soft tissue at the base of the neck as well as on the chest wall.  Under direct ultrasound visualization Seldinger needle is inserted into the right internal jugular vein. J-wire is advanced under fluoroscopic guidance. A small counterincision was created at the wire insertion site. A transverse incision is created 2 fingerbreadths below the scapula and a pocket is fashioned using both blunt and sharp dissection. The pocket is tested for appropriate size with the hub of the Infuse-a-Port.  The tunneling device is then used to pull the intravascular portion of the catheter from the pocket to the neck counterincision.  Dilator and peel-away sheath were then inserted over the wire and the wire is removed. Catheter is then advanced into the venous system without difficulty. Peel-away sheath was then removed.  Catheter is then positioned under fluoroscopic guidance at the atrial caval junction. It is then transected connected to the hub and the hope is slipped into the subcutaneous pocket on the chest wall. The hub was then accessed percutaneously and aspirates easily and flushes well and is flushed with 30 cc of heparinized saline. The pocket incision is then closed in layers using interrupted 3-0 Vicryl for the subcutaneous tissues and 4-0 Monocryl subcuticular for skin closure. Dermabond is applied. The neck counterincision was closed with 4-0 Monocryl subcuticular and Dermabond as well.  The patient tolerated the procedure well and there were no immediate complications.  COMPLICATIONS: None  CONDITION: Unchanged  Katha Cabal M.D. [facility] Office: [phone]   11/26/2015, 11:58 AM

## 2015-11-26 NOTE — Discharge Instructions (Signed)
Implanted Port Insertion, Care After °Refer to this sheet in the next few weeks. These instructions provide you with information on caring for yourself after your procedure. Your health care provider may also give you more specific instructions. Your treatment has been planned according to current medical practices, but problems sometimes occur. Call your health care provider if you have any problems or questions after your procedure. °WHAT TO EXPECT AFTER THE PROCEDURE °After your procedure, it is typical to have the following:  °· Discomfort at the port insertion site. Ice packs to the area will help. °· Bruising on the skin over the port. This will subside in 3-4 days. °HOME CARE INSTRUCTIONS °· After your port is placed, you will get a manufacturer's information card. The card has information about your port. Keep this card with you at all times.   °· Know what kind of port you have. There are many types of ports available.   °· Wear a medical alert bracelet in case of an emergency. This can help alert health care workers that you have a port.   °· The port can stay in for as long as your health care provider believes it is necessary.   °· A home health care nurse may give medicines and take care of the port.   °· You or a family member can get special training and directions for giving medicine and taking care of the port at home.   °SEEK MEDICAL CARE IF:  °· Your port does not flush or you are unable to get a blood return.   °· You have a fever or chills. °SEEK IMMEDIATE MEDICAL CARE IF: °· You have new fluid or pus coming from your incision.   °· You notice a bad smell coming from your incision site.   °· You have swelling, pain, or more redness at the incision or port site.   °· You have chest pain or shortness of breath. °  °This information is not intended to replace advice given to you by your health care provider. Make sure you discuss any questions you have with your health care provider. °  °Document  Released: 08/16/2013 Document Revised: 10/31/2013 Document Reviewed: 08/16/2013 °Elsevier Interactive Patient Education ©2016 [employer] Inc[employer] ° °

## 2015-11-27 ENCOUNTER — Encounter: Payer: Self-pay | Admitting: Vascular Surgery

## 2015-11-28 ENCOUNTER — Inpatient Hospital Stay: Payer: Medicare Other

## 2015-11-28 DIAGNOSIS — C61 Malignant neoplasm of prostate: Secondary | ICD-10-CM

## 2015-11-28 DIAGNOSIS — Z5111 Encounter for antineoplastic chemotherapy: Secondary | ICD-10-CM | POA: Diagnosis not present

## 2015-11-28 LAB — COMPREHENSIVE METABOLIC PANEL
ALBUMIN: 3.4 g/dL — AB (ref 3.5–5.0)
ALT: 18 U/L (ref 17–63)
ANION GAP: 5 (ref 5–15)
AST: 18 U/L (ref 15–41)
Alkaline Phosphatase: 184 U/L — ABNORMAL HIGH (ref 38–126)
BUN: 23 mg/dL — AB (ref 6–20)
CHLORIDE: 102 mmol/L (ref 101–111)
CO2: 29 mmol/L (ref 22–32)
Calcium: 8.9 mg/dL (ref 8.9–10.3)
Creatinine, Ser: 0.87 mg/dL (ref 0.61–1.24)
GFR calc Af Amer: >60 mL/min (ref >60)
Glucose, Bld: 105 mg/dL — ABNORMAL HIGH (ref 65–99)
POTASSIUM: 4.4 mmol/L (ref 3.5–5.1)
Sodium: 136 mmol/L (ref 135–145)
Total Bilirubin: 0.4 mg/dL (ref 0.3–1.2)
Total Protein: 7.4 g/dL (ref 6.5–8.1)

## 2015-11-28 LAB — CBC WITH DIFFERENTIAL/PLATELET
BASOS ABS: 0.1 10*3/uL (ref 0–0.1)
Basophils Relative: 0 %
EOS ABS: 0 10*3/uL (ref 0–0.7)
HCT: 30.1 % — ABNORMAL LOW (ref 40.0–52.0)
Hemoglobin: 9.4 g/dL — ABNORMAL LOW (ref 13.0–18.0)
Lymphocytes Relative: 14 %
Lymphs Abs: 4.2 10*3/uL — ABNORMAL HIGH (ref 1.0–3.6)
MCH: 26.4 pg (ref 26.0–34.0)
MCHC: 31.1 g/dL — AB (ref 32.0–36.0)
MCV: 85.1 fL (ref 80.0–100.0)
Monocytes Absolute: 1.3 10*3/uL — ABNORMAL HIGH (ref 0.2–1.0)
Monocytes Relative: 5 %
Neutro Abs: 23.4 10*3/uL — ABNORMAL HIGH (ref 1.4–6.5)
Neutrophils Relative %: 81 %
PLATELETS: 120 10*3/uL — AB (ref 150–440)
RBC: 3.54 MIL/uL — AB (ref 4.40–5.90)
RDW: 15.9 % — AB (ref 11.5–14.5)
WBC: 29.1 10*3/uL — AB (ref 3.8–10.6)

## 2015-11-28 MED ORDER — DENOSUMAB 120 MG/1.7ML ~~LOC~~ SOLN
120.0000 mg | Freq: Once | SUBCUTANEOUS | Status: AC
  Administered 2015-11-28: 120 mg via SUBCUTANEOUS
  Filled 2015-11-28: qty 1.7

## 2015-11-28 MED ORDER — DEGARELIX ACETATE 80 MG ~~LOC~~ SOLR
80.0000 mg | Freq: Once | SUBCUTANEOUS | Status: AC
  Administered 2015-11-28: 80 mg via SUBCUTANEOUS
  Filled 2015-11-28: qty 4

## 2015-12-02 ENCOUNTER — Other Ambulatory Visit: Payer: Self-pay | Admitting: *Deleted

## 2015-12-02 DIAGNOSIS — C61 Malignant neoplasm of prostate: Secondary | ICD-10-CM

## 2015-12-05 ENCOUNTER — Inpatient Hospital Stay: Payer: Medicare Other

## 2015-12-05 ENCOUNTER — Encounter: Payer: Self-pay | Admitting: *Deleted

## 2015-12-05 ENCOUNTER — Encounter: Payer: Self-pay | Admitting: Oncology

## 2015-12-05 ENCOUNTER — Inpatient Hospital Stay (HOSPITAL_BASED_OUTPATIENT_CLINIC_OR_DEPARTMENT_OTHER): Payer: Medicare Other | Admitting: Oncology

## 2015-12-05 VITALS — BP 135/80 | HR 84 | Temp 96.3°F | Resp 18 | Wt 150.8 lb

## 2015-12-05 VITALS — BP 115/72 | HR 70 | Resp 18

## 2015-12-05 DIAGNOSIS — C61 Malignant neoplasm of prostate: Secondary | ICD-10-CM

## 2015-12-05 DIAGNOSIS — C787 Secondary malignant neoplasm of liver and intrahepatic bile duct: Secondary | ICD-10-CM

## 2015-12-05 DIAGNOSIS — Z5111 Encounter for antineoplastic chemotherapy: Secondary | ICD-10-CM | POA: Diagnosis not present

## 2015-12-05 DIAGNOSIS — R9721 Rising PSA following treatment for malignant neoplasm of prostate: Secondary | ICD-10-CM | POA: Diagnosis not present

## 2015-12-05 DIAGNOSIS — C7951 Secondary malignant neoplasm of bone: Secondary | ICD-10-CM

## 2015-12-05 DIAGNOSIS — Z79899 Other long term (current) drug therapy: Secondary | ICD-10-CM

## 2015-12-05 DIAGNOSIS — G479 Sleep disorder, unspecified: Secondary | ICD-10-CM

## 2015-12-05 DIAGNOSIS — Z7689 Persons encountering health services in other specified circumstances: Secondary | ICD-10-CM

## 2015-12-05 LAB — CBC WITH DIFFERENTIAL/PLATELET
BASOS ABS: 0.1 10*3/uL (ref 0–0.1)
Basophils Relative: 1 %
EOS PCT: 0 %
Eosinophils Absolute: 0 10*3/uL (ref 0–0.7)
HCT: 28 % — ABNORMAL LOW (ref 40.0–52.0)
Hemoglobin: 9.3 g/dL — ABNORMAL LOW (ref 13.0–18.0)
LYMPHS PCT: 29 %
Lymphs Abs: 3.6 10*3/uL (ref 1.0–3.6)
MCH: 28.1 pg (ref 26.0–34.0)
MCHC: 33.2 g/dL (ref 32.0–36.0)
MCV: 84.7 fL (ref 80.0–100.0)
Monocytes Absolute: 1.1 10*3/uL — ABNORMAL HIGH (ref 0.2–1.0)
Monocytes Relative: 9 %
NEUTROS ABS: 7.5 10*3/uL — AB (ref 1.4–6.5)
Neutrophils Relative %: 61 %
PLATELETS: 316 10*3/uL (ref 150–440)
RBC: 3.3 MIL/uL — AB (ref 4.40–5.90)
RDW: 15.7 % — ABNORMAL HIGH (ref 11.5–14.5)
WBC: 12.3 10*3/uL — AB (ref 3.8–10.6)

## 2015-12-05 LAB — COMPREHENSIVE METABOLIC PANEL
ALBUMIN: 3.3 g/dL — AB (ref 3.5–5.0)
ALT: 17 U/L (ref 17–63)
AST: 16 U/L (ref 15–41)
Alkaline Phosphatase: 134 U/L — ABNORMAL HIGH (ref 38–126)
Anion gap: 6 (ref 5–15)
BUN: 16 mg/dL (ref 6–20)
CHLORIDE: 106 mmol/L (ref 101–111)
CO2: 25 mmol/L (ref 22–32)
CREATININE: 0.97 mg/dL (ref 0.61–1.24)
Calcium: 8.4 mg/dL — ABNORMAL LOW (ref 8.9–10.3)
GFR calc Af Amer: >60 mL/min (ref >60)
GLUCOSE: 113 mg/dL — AB (ref 65–99)
Potassium: 3.7 mmol/L (ref 3.5–5.1)
SODIUM: 137 mmol/L (ref 135–145)
Total Bilirubin: 0.5 mg/dL (ref 0.3–1.2)
Total Protein: 7.4 g/dL (ref 6.5–8.1)

## 2015-12-05 MED ORDER — FENTANYL 50 MCG/HR TD PT72: 50.0000 ug | MEDICATED_PATCH | TRANSDERMAL | Status: DC

## 2015-12-05 MED ORDER — OXYCODONE HCL 10 MG PO TABS: 10.0000 mg | ORAL_TABLET | Freq: Four times a day (QID) | ORAL | Status: DC | PRN

## 2015-12-05 MED ORDER — SODIUM CHLORIDE 0.9 % IV SOLN
Freq: Once | INTRAVENOUS | Status: AC
  Administered 2015-12-05: 11:00:00 via INTRAVENOUS
  Filled 2015-12-05: qty 1000

## 2015-12-05 MED ORDER — PALONOSETRON HCL INJECTION 0.25 MG/5ML
0.2500 mg | Freq: Once | INTRAVENOUS | Status: AC
  Administered 2015-12-05: 0.25 mg via INTRAVENOUS
  Filled 2015-12-05: qty 5

## 2015-12-05 MED ORDER — FOSAPREPITANT DIMEGLUMINE INJECTION 150 MG
Freq: Once | INTRAVENOUS | Status: AC
  Administered 2015-12-05: 11:00:00 via INTRAVENOUS
  Filled 2015-12-05: qty 5

## 2015-12-05 MED ORDER — RANITIDINE HCL 150 MG PO TABS: 150.0000 mg | ORAL_TABLET | Freq: Every day | ORAL | Status: DC

## 2015-12-05 MED ORDER — HEPARIN SOD (PORK) LOCK FLUSH 100 UNIT/ML IV SOLN
500.0000 [IU] | Freq: Once | INTRAVENOUS | Status: AC
Start: 2015-12-05 — End: 2015-12-05
  Administered 2015-12-05: 500 [IU] via INTRAVENOUS
  Filled 2015-12-05: qty 5

## 2015-12-05 MED ORDER — DOCETAXEL CHEMO INJECTION 160 MG/16ML
70.0000 mg/m2 | Freq: Once | INTRAVENOUS | Status: AC
  Administered 2015-12-05: 130 mg via INTRAVENOUS
  Filled 2015-12-05: qty 13

## 2015-12-05 MED ORDER — SODIUM CHLORIDE 0.9% FLUSH
10.0000 mL | INTRAVENOUS | Status: DC | PRN
  Administered 2015-12-05: 10 mL via INTRAVENOUS
  Filled 2015-12-05: qty 10

## 2015-12-05 MED ORDER — PEGFILGRASTIM 6 MG/0.6ML ~~LOC~~ PSKT
6.0000 mg | PREFILLED_SYRINGE | Freq: Once | SUBCUTANEOUS | Status: AC
  Administered 2015-12-05: 6 mg via SUBCUTANEOUS
  Filled 2015-12-05: qty 0.6

## 2015-12-05 NOTE — Progress Notes (Signed)
[facility] Telephone:([phone]) [phone]  Fax:([phone]) [place]: [id number]  MR#: [id number]  AVW#:[id number]  Patient Care Team: Pcp Not In System as PCP - General  CHIEF COMPLAINT:  Chief Complaint  Patient presents with  . Prostate Cancer   Oncology History   1.MRI scan on August 4 of 2015  revealed in your able liver lesion and multiple areas  of   abnormallity  in the bone suggestive off metastases  2.High PSA. 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  Patient has been started on [facility] (August, 2015) and Delton See, 4.patient started on [facility] protocol with Gillermina Phy nd ZYTIGA (May, 2016)      5.  Progressing disease on [facility] protocol by tumor markers and CT scan criteria 7.  Patient has been started on Taxotere and prednisone from January of 2017  Oncology Flowsheet 08/29/2015 09/26/2015 10/24/2015 11/14/2015 11/15/2015 11/28/2015 12/05/2015  Day, Cycle - - - Day 1, Cycle 1 - - Day 1, Cycle 2  degarelix (FIRMAGON) [place] 80 mg 80 mg 80 mg - - 80 mg -  denosumab (XGEVA) [place] 120 mg 120 mg 120 mg - - 120 mg -  dexamethasone (DECADRON) IV - - - [ 12 mg ] - - [ 12 mg ]  DOCEtaxel (TAXOTERE) IV - - - 70 mg/m2 - - 70 mg/m2  fosaprepitant (EMEND) IV - - - [ 150 mg ] - - [ 150 mg ]  palonosetron (ALOXI) IV - - - 0.25 mg - - 0.25 mg  pegfilgrastim (NEULASTA ONPRO KIT) [place] - - - - 6 mg - 6 mg   5.  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    Patient has progressing disease.  Appetite has been poor.  Right upper quadrant pain.  Losing weight.  No nausea.  No vomiting.  Pain is constant dull aching patient is on fentanyl patch and oxycodone. Patient is here to  initiate second cycle of chemotherapy.  According to him abdominal pain is improved.  Patient has developed alopecia.  Appetite has been poor has lost some weight.  He has some difficulty sleeping.  No nausea.  No vomiting.  No tingling.  No numbness.  Patient had a mild allergic reaction to Taxotere last time when infusions.  Was increased.  Did not have any diarrhea.      Patient c/o pain in the left side of his throat when he tries to swallow. Also states he is very nervous and shaky. Requesting refill for ranitidine    Patient is here to initiate   second cycle of Taxotere chemotherapy REVIEW OF SYSTEMS:   ROS GENERAL:  Feels good.  Active.  Appetite is poor and losing weight PERFORMANCE STATUS (ECOG):  01 HEENT:  No visual changes, runny nose, sore throat, mouth sores or tenderness. Lungs: No shortness of breath or cough.  No hemoptysis. Cardiac:  No chest pain, palpitations, orthopnea, or PND. GI: patient has increasing pain in the right upper quadrant. GU:  No urgency, frequency, dysuria, or hematuria. Musculoskeletal:  No back pain.  No joint pain.  No muscle tenderness. Extremities:  No pain or swelling. Skin:  No rashes or skin changes. Neuro:  No headache, numbness or weakness, balance or coordination issues. Endocrine:  No diabetes, thyroid issues, hot flashes or night sweats. Psych:  No mood changes, depression or anxiety. Pain:  No focal pain. Review of systems:  All other systems reviewed and found to be negative. As per HPI. Otherwise, a complete review of systems is negatve.  PAST MEDICAL HISTORY: Past Medical History  Diagnosis Date  . Hyperlipidemia   . Prostate cancer ([place])     2014  . Bone cancer ([place])     PAST SURGICAL HISTORY: Past Surgical History  Procedure Laterality Date  . Ankle surgery Right   . Peripheral vascular catheterization N/A 11/26/2015    Procedure: Glori Luis Cath Insertion;  Surgeon: Katha Cabal, MD;  Location: [facility];   Service: Cardiovascular;  Laterality: N/A;    FAMILY HISTORY There is no significant family history of breast cancer, ovarian cancer, colon cancer    HEALTH MAINTENANCE: Social History  Substance Use Topics  . Smoking status: Former Smoker -- 0.50 packs/day for 45 years    Types: Cigarettes    Quit date: 11/13/1991  . Smokeless tobacco: None  . Alcohol Use: 29.4 oz/week    6 Cans of beer, 43 Shots of liquor per week      Allergies  Allergen Reactions  . No Known Allergies      OBJECTIVE: Filed Vitals:   12/05/15 0935  BP: 135/80  Pulse: 84  Temp: 96.3 F (35.7 C)  Resp: 18     Body mass index is 21.64 kg/(m^2).    ECOG FS:1 - Symptomatic but completely ambulatory  Physical Exam  General  status: Performance status is good.  Patient has not lost significant weight HEENT: No evidence of stomatitis. Sclera and conjunctivae :: No jaundice.   pale looking. Lungs: Air  entry equal on both sides.  No rhonchi.  No rales.  Cardiac: Heart sounds are normal.  No pericardial rub.  No murmur. Lymphatic system: Cervical, axillary, inguinal, lymph nodes not palpable GI: Abdomen is soft.  No ascites.  Liver spleen not palpable.  No tenderness.  Bowel sounds are within normal limit Lower extremity: No edema Neurological system: Higher functions, cranial nerves intact no evidence of peripheral neuropathy. Skin: No rash.  No ecchymosis... LAB RESULTS:          No results found for: SPEP, UPEP  Lab Results  Component Value Date   WBC 12.3* 12/05/2015   NEUTROABS 7.5* 12/05/2015   HGB 9.3* 12/05/2015   HCT 28.0* 12/05/2015   MCV 84.7 12/05/2015   PLT 316 12/05/2015      Chemistry     STUDIES: No results found.  ASSESSMENT:  Stage IV carcinoma prostate.  Castration resistant prostate cancer . Progressing disease by the criteria of PSA which is rising as well as by CT scan of abdomen showing large hepatic lesion which is increasing in size.  Patient has been taken  off protocol.  Significant weight loss may be a side effect of chemotherapy was has progressing disease will continue to monitor Sleep disturbances will start patient on Remeron Pain is well controlled   Pain management Continue fentanyl patch and oxycodone. Continue second cycle of chemotherapy and recheck PSA at next cycle of treatment  Because of patient's old age and increased chance of neutropenia will proceed with prophylactic Neulasta     Staging form: Prostate, AJCC 7th Edition     Clinical: Stage IV (T3, N0, M1b, PSA: 20 or greater, Gleason 8-10 -  Poorly differentiated/undifferentiated (marked anaplasia)) - Signed by Forest Gleason, MD on 03/17/2015   Forest Gleason, MD   12/05/2015 7:53 PM          Smoking History: Smoking History 1(1)Packs per day and Quit 1993 after 30 yr history.(1)                         )     Advance Directive:  Advance Directive ([place]) yes(1)   Do you want to revise or change your advance directive? No(1)          PAST MEDICAL HISTORY: Past Medical History  Diagnosis Date  . Hyperlipidemia   . Prostate cancer ([place])     2014  . Bone cancer ([place])     PAST SURGICAL HISTORY: Past Surgical History  Procedure Laterality Date  . Ankle surgery Right   . Peripheral vascular catheterization N/A 11/26/2015    Procedure: Glori Luis Cath Insertion;  Surgeon: Katha Cabal, MD;  Location: [facility];  Service: Cardiovascular;  Laterality: N/A;    FAMILY HISTORY There is no significant family history of breast cancer, ovarian cancer, colon cancer    HEALTH MAINTENANCE: Social History  Substance Use Topics  . Smoking status: Former Smoker -- 0.50 packs/day for 45 years    Types: Cigarettes    Quit date: 11/13/1991  . Smokeless tobacco: None  . Alcohol Use: 29.4 oz/week    6 Cans of beer, 43 Shots of liquor per week      Allergies  Allergen Reactions  . No Known Allergies       OBJECTIVE: Filed Vitals:   12/05/15 0935  BP: 135/80  Pulse: 84  Temp: 96.3 F (35.7 C)  Resp: 18     Body mass index is 21.64 kg/(m^2).    ECOG FS:1 - Symptomatic but completely ambulatory  Physical Exam  General  status: Performance status is good.  Patient has not lost significant weight HEENT: No evidence of stomatitis. Sclera and conjunctivae :: No jaundice.   pale looking. Lungs: Air  entry equal on both sides.  No rhonchi.  No rales.  Cardiac: Heart sounds are normal.  No pericardial rub.  No murmur. Lymphatic system: Cervical, axillary, inguinal, lymph nodes not palpable GI: Abdomen is soft.  No ascites.  Liver spleen not palpable.  No tenderness.  Bowel sounds are within normal limit Lower extremity: No edema Neurological system: Higher functions, cranial nerves intact no evidence of peripheral neuropathy. Skin: No rash.  No ecchymosis... LAB RESULTS: Component     Latest Ref Rng 02/14/2015 03/13/2015 04/11/2015 05/09/2015 06/06/2015  PSA     0.00 - 4.00 ng/mL 229.0 (H) 311.00 (H) 44.81 (H) 42.96 (H) 41.44 (H)   Component     Latest Ref Rng 07/04/2015  PSA     0.00 - 4.00 ng/mL 32.59 (H)   Component     Latest Ref Rng 06/06/2015 07/04/2015 08/01/2015 08/29/2015  PSA     0.00 - 4.00 ng/mL 41.44 (H) 32.59 (H) 33.18 (H) 45.99 (H)   4d ago (09/26/15) 59moago (08/29/15) 263mogo (08/01/15) 52m73moo (07/04/15)    PSA 0.00 - 4.00 ng/mL 64.70 (H)            Component Value Date/Time   NA 137 12/05/2015 0821   NA 129* 02/22/2015 0932   K 3.7 12/05/2015 0821   K 4.4 02/22/2015 0932   CL 106 12/05/2015 0821   CL 96* 02/22/2015 0932  CO2 25 12/05/2015 0821   CO2 26 02/22/2015 0932   GLUCOSE 113* 12/05/2015 0821   GLUCOSE 111* 02/22/2015 0932   BUN 16 12/05/2015 0821   BUN 25* 02/22/2015 0932   CREATININE 0.97 12/05/2015 0821   CREATININE 0.96 02/22/2015 0932   CALCIUM 8.4* 12/05/2015 0821   CALCIUM 7.7* 02/22/2015 0932   PROT 7.4 12/05/2015 0821   PROT 8.0  02/22/2015 0932   ALBUMIN 3.3* 12/05/2015 0821   ALBUMIN 3.3* 02/22/2015 0932   AST 16 12/05/2015 0821   AST 65* 02/22/2015 0932   ALT 17 12/05/2015 0821   ALT 63 02/22/2015 0932   ALKPHOS 134* 12/05/2015 0821   ALKPHOS 256* 02/22/2015 0932   BILITOT 0.5 12/05/2015 0821   BILITOT 1.0 02/22/2015 0932   GFRNONAA >60 12/05/2015 0821   GFRNONAA >60 02/22/2015 0932   GFRNONAA >60 11/20/2014 0940   GFRAA >60 12/05/2015 0821   GFRAA >60 02/22/2015 0932   GFRAA >60 11/20/2014 0940    No results found for: SPEP, UPEP  Lab Results  Component Value Date   WBC 12.3* 12/05/2015   NEUTROABS 7.5* 12/05/2015   HGB 9.3* 12/05/2015   HCT 28.0* 12/05/2015   MCV 84.7 12/05/2015   PLT 316 12/05/2015      Chemistry     STUDIES: No results found.  ASSESSMENT:  Stage IV carcinoma prostate.  Castration resistant prostate cancer .  Patient is progressing based on CT scan and a PSA. CT scan has been reviewed independently and reviewed with the patient and family. Need for pain medication has been increased We will continue fentanyl patch and oxycodone for pain control  I also had discussion regarding protocol and by protocol criteria.  Will take patient off protocol at present time  Patient had been randomized to get both XTANDI and ZYTIGA  Possibility of starting chemotherapy with Taxotere has been discussed.  Intent of chemotherapy is palliation and relief in symptoms and extending survival  Patient is agreeable to it and will start patient on Taxotere Patient will attend chemotherapy class  Also discussed possibility of port placement  Total duration of visit was 45 minutes.  50% or more time was spent in counseling patient and family regarding prognosis .  Progressive disease.   Prostate cancer metastatic to multiple sites   Staging form: Prostate, AJCC 7th Edition     Clinical: Stage IV (T3, N0, M1b, PSA: 20 or greater, Gleason 8-10 - Poorly differentiated/undifferentiated  (marked anaplasia)) - Signed by Forest Gleason, MD on 03/17/2015   Forest Gleason, MD   12/05/2015 7:53 PM                                   )

## 2015-12-05 NOTE — Progress Notes (Signed)
Patient c/o pain in the left side of his throat when he tries to swallow.  Also states he is very nervous and shaky.  Requesting refill for ranitidine.

## 2015-12-06 NOTE — Progress Notes (Signed)
12/05/2015 @9 :30am  Patient returns to clinic this morning for consideration of 2nd chemo infusion with Taxotere. Patient reports he had an infusion reaction with his first infusion and this made him a little nervous. States he is doing about the same as far as side effects. Continues to report hot flashes and cough "once in awhile" and states the cough starts when it is time to change his patch(Duragesic). Denies any pain at present and reports he has not had bone pain or arthralgias since he stopped taking the study drug. B/P is back wnl this morning. He continues to report problems sleeping and says he is taking some Prednisone now for the new chemo he is receiving - Taxotere. Reports occasional nausea, which has actually improved since coming off the study, and some indigestion, which he still relates to his diet. Patient's HGB has actually decreased to 9.3 now; which is a grade 2 and likely related to the Taxotere. He does report a dry, scratchy throat this morning and states this started 3-4 days ago. He is also concerned about a "knot" on the side of his neck. These new issues were reported to Dr. Metro Kung nurse [facility], who will inform Dr. Oliva Bustard. Discussed with Mr. Gasparyan that we will continue to follow him and report his status to the study every 6 months now and he is agreeable.     Hot flashes - grade 1; unrelated Cough - grade 1; unrelated Anemia - grade 2; unrelated Insomnia- grade 1: likely related to prednisone Dyspepsia- grade 1: unrelated (per pt, is related to diet) Nausea- grade 1: unrelated

## 2015-12-12 ENCOUNTER — Inpatient Hospital Stay: Payer: Medicare Other | Attending: Oncology

## 2015-12-12 DIAGNOSIS — E785 Hyperlipidemia, unspecified: Secondary | ICD-10-CM | POA: Insufficient documentation

## 2015-12-12 DIAGNOSIS — C787 Secondary malignant neoplasm of liver and intrahepatic bile duct: Secondary | ICD-10-CM | POA: Diagnosis not present

## 2015-12-12 DIAGNOSIS — C7951 Secondary malignant neoplasm of bone: Secondary | ICD-10-CM | POA: Diagnosis not present

## 2015-12-12 DIAGNOSIS — R9721 Rising PSA following treatment for malignant neoplasm of prostate: Secondary | ICD-10-CM | POA: Insufficient documentation

## 2015-12-12 DIAGNOSIS — Z79899 Other long term (current) drug therapy: Secondary | ICD-10-CM | POA: Diagnosis not present

## 2015-12-12 DIAGNOSIS — C61 Malignant neoplasm of prostate: Secondary | ICD-10-CM | POA: Insufficient documentation

## 2015-12-12 DIAGNOSIS — Z7689 Persons encountering health services in other specified circumstances: Secondary | ICD-10-CM | POA: Insufficient documentation

## 2015-12-12 DIAGNOSIS — Z5111 Encounter for antineoplastic chemotherapy: Secondary | ICD-10-CM | POA: Insufficient documentation

## 2015-12-12 DIAGNOSIS — Z87891 Personal history of nicotine dependence: Secondary | ICD-10-CM | POA: Diagnosis not present

## 2015-12-12 LAB — CBC WITH DIFFERENTIAL/PLATELET
Basophils Absolute: 0.2 10*3/uL — ABNORMAL HIGH (ref 0–0.1)
Basophils Relative: 1 %
EOS PCT: 0 %
Eosinophils Absolute: 0.1 10*3/uL (ref 0–0.7)
HCT: 27.3 % — ABNORMAL LOW (ref 40.0–52.0)
Hemoglobin: 8.9 g/dL — ABNORMAL LOW (ref 13.0–18.0)
LYMPHS ABS: 5.4 10*3/uL — AB (ref 1.0–3.6)
LYMPHS PCT: 22 %
MCH: 27.7 pg (ref 26.0–34.0)
MCHC: 32.5 g/dL (ref 32.0–36.0)
MCV: 85.1 fL (ref 80.0–100.0)
MONO ABS: 0.7 10*3/uL (ref 0.2–1.0)
MONOS PCT: 3 %
Neutro Abs: 18.1 10*3/uL — ABNORMAL HIGH (ref 1.4–6.5)
Neutrophils Relative %: 74 %
Platelets: 280 10*3/uL (ref 150–440)
RBC: 3.2 MIL/uL — ABNORMAL LOW (ref 4.40–5.90)
RDW: 16.5 % — AB (ref 11.5–14.5)
WBC: 24.5 10*3/uL — ABNORMAL HIGH (ref 3.8–10.6)

## 2015-12-13 ENCOUNTER — Inpatient Hospital Stay: Payer: Medicare Other

## 2015-12-19 ENCOUNTER — Inpatient Hospital Stay: Payer: Medicare Other

## 2015-12-19 DIAGNOSIS — C61 Malignant neoplasm of prostate: Secondary | ICD-10-CM

## 2015-12-19 DIAGNOSIS — Z5111 Encounter for antineoplastic chemotherapy: Secondary | ICD-10-CM | POA: Diagnosis not present

## 2015-12-19 LAB — CBC WITH DIFFERENTIAL/PLATELET
BASOS PCT: 1 %
Basophils Absolute: 0.1 10*3/uL (ref 0–0.1)
EOS ABS: 0 10*3/uL (ref 0–0.7)
EOS PCT: 0 %
HEMATOCRIT: 26.9 % — AB (ref 40.0–52.0)
Hemoglobin: 8.8 g/dL — ABNORMAL LOW (ref 13.0–18.0)
Lymphocytes Relative: 19 %
Lymphs Abs: 4.3 10*3/uL — ABNORMAL HIGH (ref 1.0–3.6)
MCH: 28.2 pg (ref 26.0–34.0)
MCHC: 32.8 g/dL (ref 32.0–36.0)
MCV: 86.2 fL (ref 80.0–100.0)
MONO ABS: 1 10*3/uL (ref 0.2–1.0)
MONOS PCT: 5 %
NEUTROS ABS: 17.5 10*3/uL — AB (ref 1.4–6.5)
Neutrophils Relative %: 75 %
Platelets: 155 10*3/uL (ref 150–440)
RBC: 3.12 MIL/uL — ABNORMAL LOW (ref 4.40–5.90)
RDW: 18.5 % — AB (ref 11.5–14.5)
WBC: 23 10*3/uL — ABNORMAL HIGH (ref 3.8–10.6)

## 2015-12-19 LAB — PSA: PSA: 218 ng/mL — ABNORMAL HIGH (ref 0.00–4.00)

## 2015-12-26 ENCOUNTER — Inpatient Hospital Stay: Payer: Medicare Other

## 2015-12-26 ENCOUNTER — Inpatient Hospital Stay (HOSPITAL_BASED_OUTPATIENT_CLINIC_OR_DEPARTMENT_OTHER): Payer: Medicare Other | Admitting: Oncology

## 2015-12-26 ENCOUNTER — Encounter: Payer: Self-pay | Admitting: Oncology

## 2015-12-26 VITALS — BP 158/68 | HR 72 | Temp 97.8°F | Resp 18 | Wt 156.7 lb

## 2015-12-26 DIAGNOSIS — C61 Malignant neoplasm of prostate: Secondary | ICD-10-CM | POA: Diagnosis not present

## 2015-12-26 DIAGNOSIS — Z79899 Other long term (current) drug therapy: Secondary | ICD-10-CM

## 2015-12-26 DIAGNOSIS — R9721 Rising PSA following treatment for malignant neoplasm of prostate: Secondary | ICD-10-CM

## 2015-12-26 DIAGNOSIS — C787 Secondary malignant neoplasm of liver and intrahepatic bile duct: Secondary | ICD-10-CM | POA: Diagnosis not present

## 2015-12-26 DIAGNOSIS — C7951 Secondary malignant neoplasm of bone: Secondary | ICD-10-CM

## 2015-12-26 DIAGNOSIS — Z5111 Encounter for antineoplastic chemotherapy: Secondary | ICD-10-CM | POA: Diagnosis not present

## 2015-12-26 LAB — CBC WITH DIFFERENTIAL/PLATELET
BASOS ABS: 0.1 10*3/uL (ref 0–0.1)
Basophils Relative: 1 %
EOS ABS: 0 10*3/uL (ref 0–0.7)
EOS PCT: 0 %
HCT: 24.7 % — ABNORMAL LOW (ref 40.0–52.0)
Hemoglobin: 8.2 g/dL — ABNORMAL LOW (ref 13.0–18.0)
LYMPHS PCT: 18 %
Lymphs Abs: 2.5 10*3/uL (ref 1.0–3.6)
MCH: 28.6 pg (ref 26.0–34.0)
MCHC: 33.1 g/dL (ref 32.0–36.0)
MCV: 86.6 fL (ref 80.0–100.0)
MONO ABS: 1.3 10*3/uL — AB (ref 0.2–1.0)
Monocytes Relative: 10 %
Neutro Abs: 9.6 10*3/uL — ABNORMAL HIGH (ref 1.4–6.5)
Neutrophils Relative %: 71 %
PLATELETS: 239 10*3/uL (ref 150–440)
RBC: 2.85 MIL/uL — ABNORMAL LOW (ref 4.40–5.90)
RDW: 20.3 % — AB (ref 11.5–14.5)
WBC: 13.5 10*3/uL — ABNORMAL HIGH (ref 3.8–10.6)

## 2015-12-26 LAB — COMPREHENSIVE METABOLIC PANEL
ALT: 16 U/L — ABNORMAL LOW (ref 17–63)
AST: 17 U/L (ref 15–41)
Albumin: 3.6 g/dL (ref 3.5–5.0)
Alkaline Phosphatase: 121 U/L (ref 38–126)
Anion gap: 5 (ref 5–15)
BUN: 21 mg/dL — ABNORMAL HIGH (ref 6–20)
CHLORIDE: 106 mmol/L (ref 101–111)
CO2: 26 mmol/L (ref 22–32)
Calcium: 8.9 mg/dL (ref 8.9–10.3)
Creatinine, Ser: 0.89 mg/dL (ref 0.61–1.24)
Glucose, Bld: 106 mg/dL — ABNORMAL HIGH (ref 65–99)
POTASSIUM: 4.7 mmol/L (ref 3.5–5.1)
SODIUM: 137 mmol/L (ref 135–145)
Total Bilirubin: 0.6 mg/dL (ref 0.3–1.2)
Total Protein: 7.1 g/dL (ref 6.5–8.1)

## 2015-12-26 MED ORDER — HEPARIN SOD (PORK) LOCK FLUSH 100 UNIT/ML IV SOLN
500.0000 [IU] | Freq: Once | INTRAVENOUS | Status: AC
  Administered 2015-12-26: 500 [IU] via INTRAVENOUS

## 2015-12-26 MED ORDER — DOCETAXEL CHEMO INJECTION 160 MG/16ML
70.0000 mg/m2 | Freq: Once | INTRAVENOUS | Status: AC
  Administered 2015-12-26: 130 mg via INTRAVENOUS
  Filled 2015-12-26: qty 13

## 2015-12-26 MED ORDER — PALONOSETRON HCL INJECTION 0.25 MG/5ML
0.2500 mg | Freq: Once | INTRAVENOUS | Status: AC
  Administered 2015-12-26: 0.25 mg via INTRAVENOUS
  Filled 2015-12-26: qty 5

## 2015-12-26 MED ORDER — PEGFILGRASTIM 6 MG/0.6ML ~~LOC~~ PSKT
6.0000 mg | PREFILLED_SYRINGE | Freq: Once | SUBCUTANEOUS | Status: AC
  Administered 2015-12-26: 6 mg via SUBCUTANEOUS
  Filled 2015-12-26: qty 0.6

## 2015-12-26 MED ORDER — SODIUM CHLORIDE 0.9% FLUSH
10.0000 mL | INTRAVENOUS | Status: DC | PRN
  Filled 2015-12-26: qty 10

## 2015-12-26 MED ORDER — SODIUM CHLORIDE 0.9 % IV SOLN
Freq: Once | INTRAVENOUS | Status: AC
  Administered 2015-12-26: 10:00:00 via INTRAVENOUS
  Filled 2015-12-26: qty 5

## 2015-12-26 MED ORDER — SODIUM CHLORIDE 0.9 % IV SOLN
Freq: Once | INTRAVENOUS | Status: AC
  Administered 2015-12-26: 09:00:00 via INTRAVENOUS
  Filled 2015-12-26: qty 1000

## 2015-12-26 MED ORDER — HEPARIN SOD (PORK) LOCK FLUSH 100 UNIT/ML IV SOLN
INTRAVENOUS | Status: AC
  Filled 2015-12-26: qty 5

## 2015-12-26 NOTE — Progress Notes (Signed)
[place] @ [facility] Telephone:(336) [phone]  Fax:(336) [place]: 04/03/1941  MR#: [id number]  ZRA#:[id number]  Patient Care Team: Pcp Not In System as PCP - General  CHIEF COMPLAINT:  Chief Complaint  Patient presents with  . Prostate Cancer   Oncology History   1.MRI scan on August 4 of 2015  revealed in your able liver lesion and multiple areas  of   abnormallity  in the bone suggestive off metastases  2.High PSA. 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  Patient has been started on [facility] (August, 2015) and Delton See, 4.patient started on Alliance protocol with Gillermina Phy nd ZYTIGA (May, 2016)      5.  Progressing disease on Alliance protocol by tumor markers and CT scan criteria 7.  Patient has been started on Taxotere and prednisone from January of 2017  Oncology Flowsheet 08/29/2015 09/26/2015 10/24/2015 11/14/2015 11/15/2015 11/28/2015 12/05/2015  Day, Cycle - - - Day 1, Cycle 1 - - Day 1, Cycle 2  degarelix (FIRMAGON) [place] 80 mg 80 mg 80 mg - - 80 mg -  denosumab (XGEVA) [place] 120 mg 120 mg 120 mg - - 120 mg -  dexamethasone (DECADRON) IV - - - [ 12 mg ] - - [ 12 mg ]  DOCEtaxel (TAXOTERE) IV - - - 70 mg/m2 - - 70 mg/m2  fosaprepitant (EMEND) IV - - - [ 150 mg ] - - [ 150 mg ]  palonosetron (ALOXI) IV - - - 0.25 mg - - 0.25 mg  pegfilgrastim (NEULASTA ONPRO KIT) [place] - - - - 6 mg - 6 mg   5.  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    Patient has progressing disease.  Appetite has been poor.  Right upper quadrant pain.  Losing weight.  No nausea.  No vomiting.  Pain is constant dull aching patient is on fentanyl patch and oxycodone. Patient is here to  initiate second cycle of chemotherapy.  According to him abdominal pain is improved.  Patient has developed alopecia.  Appetite has been poor has lost some weight.  He has some difficulty sleeping.  No nausea.  No vomiting.  No tingling.  No numbness.  Patient had a mild allergic reaction to Taxotere last time when infusions.  Was increased.  Did not have any diarrhea.     Patient is here for ongoing evaluation and treatment consideration No chills.  No fever.  No nausea.  No vomiting.  No diarrhea.  Appetite is improved.  PSA shows a downward trend.  No tingling numbness Patient is here for ongoing evaluation and consideration of treatment. REVIEW OF SYSTEMS:   ROS GENERAL:  Feels good.  Active.  Appetite is poor and losing weight PERFORMANCE STATUS (ECOG):  01 HEENT:  No visual changes, runny nose, sore throat, mouth sores or tenderness. Lungs: No shortness of breath or cough.  No hemoptysis. Cardiac:  No chest pain, palpitations, orthopnea, or PND. GI: patient has increasing pain in the right upper quadrant. GU:  No urgency, frequency, dysuria, or hematuria. Musculoskeletal:  No back pain.  No joint pain.  No muscle tenderness. Extremities:  No pain or swelling. Skin:  No rashes or  skin changes. Neuro:  No headache, numbness or weakness, balance or coordination issues. Endocrine:  No diabetes, thyroid issues, hot flashes or night sweats. Psych:  No mood changes, depression or anxiety. Pain:  No focal pain. Review of systems:  All other systems reviewed and found to be negative. As per HPI. Otherwise, a complete review of systems is negatve.  PAST MEDICAL HISTORY: Past Medical History  Diagnosis Date  . Hyperlipidemia   . Prostate cancer ([place])     2014  . Bone cancer ([place])     PAST SURGICAL HISTORY: Past Surgical History  Procedure Laterality Date  . Ankle surgery Right   . Peripheral vascular catheterization N/A 11/26/2015    Procedure: Glori Luis Cath Insertion;  Surgeon:  Katha Cabal, MD;  Location: [place] [facility];  Service: Cardiovascular;  Laterality: N/A;    FAMILY HISTORY There is no significant family history of breast cancer, ovarian cancer, colon cancer    HEALTH MAINTENANCE: Social History  Substance Use Topics  . Smoking status: Former Smoker -- 0.50 packs/day for 45 years    Types: Cigarettes    Quit date: 11/13/1991  . Smokeless tobacco: None  . Alcohol Use: 29.4 oz/week    6 Cans of beer, 43 Shots of liquor per week      Allergies  Allergen Reactions  . No Known Allergies      OBJECTIVE: Filed Vitals:   12/26/15 0838  BP: 158/68  Pulse: 72  Temp: 97.8 F (36.6 C)  Resp: 18     Body mass index is 22.49 kg/(m^2).    ECOG FS:1 - Symptomatic but completely ambulatory  Physical Exam  General  status: Performance status is good.  Patient has not lost significant weight HEENT: No evidence of stomatitis. Sclera and conjunctivae :: No jaundice.   pale looking. Lungs: Air  entry equal on both sides.  No rhonchi.  No rales.  Cardiac: Heart sounds are normal.  No pericardial rub.  No murmur. Lymphatic system: Cervical, axillary, inguinal, lymph nodes not palpable GI: Abdomen is soft.  No ascites.  Liver spleen not palpable.  No tenderness.  Bowel sounds are within normal limit Lower extremity: No edema Neurological system: Higher functions, cranial nerves intact no evidence of peripheral neuropathy. Skin: No rash.  No ecchymosis... LAB RESULTS:          No results found for: SPEP, UPEP  Lab Results  Component Value Date   WBC 13.5* 12/26/2015   NEUTROABS 9.6* 12/26/2015   HGB 8.2* 12/26/2015   HCT 24.7* 12/26/2015   MCV 86.6 12/26/2015   PLT 239 12/26/2015      Chemistry     STUDIES: No results found.  ASSESSMENT:  Stage IV carcinoma prostate.  Castration resistant prostate cancer . Progressing disease by the criteria of PSA which is rising as well as by CT scan of abdomen showing large hepatic  lesion which is increasing in size.  Patient has been taken off protocol.  Significant weight loss may be a side effect of chemotherapy was has progressing disease will continue to monitor Sleep disturbances will start patient on Remeron Pain is well controlled   Pain management Continue fentanyl patch and oxycodone. Continue second cycle of chemotherapy and recheck PSA at next cycle of treatment  Because of patient's old age and increased chance of neutropenia will proceed with prophylactic Neulasta     Staging form: Prostate, AJCC 7th Edition     Clinical: Stage IV (T3, N0, M1b, PSA: 20 or  greater, Gleason 8-10 - Poorly differentiated/undifferentiated (marked anaplasia)) - Signed by Forest Gleason, MD on 03/17/2015   Forest Gleason, MD   12/26/2015 8:43 AM          Smoking History: Smoking History 1(1)Packs per day and Quit 1993 after 30 yr history.(1)                         )     Advance Directive:  Advance Directive ([place]) yes(1)   Do you want to revise or change your advance directive? No(1)                                 )           [place] @ [facility]                           )

## 2016-01-02 ENCOUNTER — Inpatient Hospital Stay: Payer: Medicare Other

## 2016-01-02 DIAGNOSIS — Z5111 Encounter for antineoplastic chemotherapy: Secondary | ICD-10-CM | POA: Diagnosis not present

## 2016-01-02 DIAGNOSIS — C61 Malignant neoplasm of prostate: Secondary | ICD-10-CM

## 2016-01-02 LAB — CBC WITH DIFFERENTIAL/PLATELET
BAND NEUTROPHILS: 16 %
BASOS ABS: 0 10*3/uL (ref 0–0.1)
BASOS PCT: 0 %
EOS PCT: 0 %
Eosinophils Absolute: 0 10*3/uL (ref 0–0.7)
HCT: 25 % — ABNORMAL LOW (ref 40.0–52.0)
Hemoglobin: 8 g/dL — ABNORMAL LOW (ref 13.0–18.0)
LYMPHS ABS: 3.2 10*3/uL (ref 1.0–3.6)
Lymphocytes Relative: 10 %
MCH: 28.1 pg (ref 26.0–34.0)
MCHC: 32.2 g/dL (ref 32.0–36.0)
MCV: 87.3 fL (ref 80.0–100.0)
MONO ABS: 3.2 10*3/uL — AB (ref 0.2–1.0)
MYELOCYTES: 4 %
Metamyelocytes Relative: 3 %
Monocytes Relative: 10 %
NEUTROS PCT: 57 %
Neutro Abs: 25.4 10*3/uL — ABNORMAL HIGH (ref 1.4–6.5)
Platelets: 227 10*3/uL (ref 150–440)
RBC: 2.86 MIL/uL — ABNORMAL LOW (ref 4.40–5.90)
RDW: 21 % — AB (ref 11.5–14.5)
Smear Review: ADEQUATE
WBC: 31.8 10*3/uL — ABNORMAL HIGH (ref 3.8–10.6)
nRBC: 3 /100 WBC — ABNORMAL HIGH

## 2016-01-02 MED ORDER — DENOSUMAB 120 MG/1.7ML ~~LOC~~ SOLN
120.0000 mg | Freq: Once | SUBCUTANEOUS | Status: AC
Start: 2016-01-02 — End: 2016-01-02
  Administered 2016-01-02: 120 mg via SUBCUTANEOUS
  Filled 2016-01-02: qty 1.7

## 2016-01-02 MED ORDER — DEGARELIX ACETATE 80 MG ~~LOC~~ SOLR
80.0000 mg | Freq: Once | SUBCUTANEOUS | Status: AC
  Administered 2016-01-02: 80 mg via SUBCUTANEOUS
  Filled 2016-01-02: qty 4

## 2016-01-06 ENCOUNTER — Telehealth: Payer: Self-pay | Admitting: *Deleted

## 2016-01-06 MED ORDER — BENZONATATE 100 MG PO CAPS: 100.0000 mg | ORAL_CAPSULE | Freq: Three times a day (TID) | ORAL | Status: AC

## 2016-01-06 NOTE — Telephone Encounter (Signed)
Pt's caregiver requests medication to be called in to help with cough. Per Magda Paganini, NP will escribed tessalon perles TID. Pharmacy will contact pt's caregiver when prescription is ready.

## 2016-01-09 ENCOUNTER — Inpatient Hospital Stay: Payer: Medicare Other | Attending: Oncology

## 2016-01-09 DIAGNOSIS — Z87891 Personal history of nicotine dependence: Secondary | ICD-10-CM | POA: Insufficient documentation

## 2016-01-09 DIAGNOSIS — E785 Hyperlipidemia, unspecified: Secondary | ICD-10-CM | POA: Diagnosis not present

## 2016-01-09 DIAGNOSIS — Z79899 Other long term (current) drug therapy: Secondary | ICD-10-CM | POA: Insufficient documentation

## 2016-01-09 DIAGNOSIS — Z7689 Persons encountering health services in other specified circumstances: Secondary | ICD-10-CM | POA: Diagnosis not present

## 2016-01-09 DIAGNOSIS — C7951 Secondary malignant neoplasm of bone: Secondary | ICD-10-CM | POA: Diagnosis not present

## 2016-01-09 DIAGNOSIS — C61 Malignant neoplasm of prostate: Secondary | ICD-10-CM | POA: Diagnosis not present

## 2016-01-09 DIAGNOSIS — R9721 Rising PSA following treatment for malignant neoplasm of prostate: Secondary | ICD-10-CM | POA: Diagnosis not present

## 2016-01-09 DIAGNOSIS — C787 Secondary malignant neoplasm of liver and intrahepatic bile duct: Secondary | ICD-10-CM | POA: Insufficient documentation

## 2016-01-09 DIAGNOSIS — Z5111 Encounter for antineoplastic chemotherapy: Secondary | ICD-10-CM | POA: Insufficient documentation

## 2016-01-09 LAB — CBC WITH DIFFERENTIAL/PLATELET
BASOS ABS: 0.1 10*3/uL (ref 0–0.1)
BASOS PCT: 0 %
Eosinophils Absolute: 0 10*3/uL (ref 0–0.7)
Eosinophils Relative: 0 %
HEMATOCRIT: 28.2 % — AB (ref 40.0–52.0)
HEMOGLOBIN: 9.1 g/dL — AB (ref 13.0–18.0)
LYMPHS PCT: 17 %
Lymphs Abs: 3.3 10*3/uL (ref 1.0–3.6)
MCH: 28.3 pg (ref 26.0–34.0)
MCHC: 32.1 g/dL (ref 32.0–36.0)
MCV: 88.1 fL (ref 80.0–100.0)
MONOS PCT: 5 %
Monocytes Absolute: 1.1 10*3/uL — ABNORMAL HIGH (ref 0.2–1.0)
NEUTROS ABS: 15.4 10*3/uL — AB (ref 1.4–6.5)
NEUTROS PCT: 78 %
Platelets: 192 10*3/uL (ref 150–440)
RBC: 3.2 MIL/uL — ABNORMAL LOW (ref 4.40–5.90)
RDW: 21.9 % — ABNORMAL HIGH (ref 11.5–14.5)
WBC: 19.8 10*3/uL — ABNORMAL HIGH (ref 3.8–10.6)

## 2016-01-16 ENCOUNTER — Inpatient Hospital Stay: Payer: Medicare Other

## 2016-01-16 ENCOUNTER — Inpatient Hospital Stay (HOSPITAL_BASED_OUTPATIENT_CLINIC_OR_DEPARTMENT_OTHER): Payer: Medicare Other | Admitting: Oncology

## 2016-01-16 ENCOUNTER — Encounter: Payer: Self-pay | Admitting: Oncology

## 2016-01-16 VITALS — BP 159/79 | HR 109 | Temp 97.1°F | Resp 18 | Wt 145.3 lb

## 2016-01-16 DIAGNOSIS — C61 Malignant neoplasm of prostate: Secondary | ICD-10-CM

## 2016-01-16 DIAGNOSIS — Z5111 Encounter for antineoplastic chemotherapy: Secondary | ICD-10-CM | POA: Diagnosis not present

## 2016-01-16 DIAGNOSIS — C7951 Secondary malignant neoplasm of bone: Secondary | ICD-10-CM

## 2016-01-16 DIAGNOSIS — C787 Secondary malignant neoplasm of liver and intrahepatic bile duct: Secondary | ICD-10-CM

## 2016-01-16 DIAGNOSIS — R9721 Rising PSA following treatment for malignant neoplasm of prostate: Secondary | ICD-10-CM | POA: Diagnosis not present

## 2016-01-16 DIAGNOSIS — Z7689 Persons encountering health services in other specified circumstances: Secondary | ICD-10-CM

## 2016-01-16 DIAGNOSIS — Z79899 Other long term (current) drug therapy: Secondary | ICD-10-CM

## 2016-01-16 LAB — CBC WITH DIFFERENTIAL/PLATELET
BASOS ABS: 0.1 10*3/uL (ref 0–0.1)
Basophils Relative: 1 %
EOS ABS: 0 10*3/uL (ref 0–0.7)
EOS PCT: 0 %
HCT: 28.8 % — ABNORMAL LOW (ref 40.0–52.0)
Hemoglobin: 9.4 g/dL — ABNORMAL LOW (ref 13.0–18.0)
LYMPHS PCT: 16 %
Lymphs Abs: 2 10*3/uL (ref 1.0–3.6)
MCH: 28.6 pg (ref 26.0–34.0)
MCHC: 32.8 g/dL (ref 32.0–36.0)
MCV: 87.2 fL (ref 80.0–100.0)
MONO ABS: 1.5 10*3/uL — AB (ref 0.2–1.0)
Monocytes Relative: 12 %
Neutro Abs: 9.2 10*3/uL — ABNORMAL HIGH (ref 1.4–6.5)
Neutrophils Relative %: 71 %
PLATELETS: 330 10*3/uL (ref 150–440)
RBC: 3.3 MIL/uL — ABNORMAL LOW (ref 4.40–5.90)
RDW: 22.2 % — AB (ref 11.5–14.5)
WBC: 12.8 10*3/uL — ABNORMAL HIGH (ref 3.8–10.6)

## 2016-01-16 LAB — COMPREHENSIVE METABOLIC PANEL
ALT: 19 U/L (ref 17–63)
AST: 26 U/L (ref 15–41)
Albumin: 3.4 g/dL — ABNORMAL LOW (ref 3.5–5.0)
Alkaline Phosphatase: 146 U/L — ABNORMAL HIGH (ref 38–126)
Anion gap: 6 (ref 5–15)
BUN: 28 mg/dL — ABNORMAL HIGH (ref 6–20)
CHLORIDE: 99 mmol/L — AB (ref 101–111)
CO2: 26 mmol/L (ref 22–32)
Calcium: 8.6 mg/dL — ABNORMAL LOW (ref 8.9–10.3)
Creatinine, Ser: 1.2 mg/dL (ref 0.61–1.24)
GFR, EST NON AFRICAN AMERICAN: 58 mL/min — AB (ref >60)
Glucose, Bld: 156 mg/dL — ABNORMAL HIGH (ref 65–99)
POTASSIUM: 4 mmol/L (ref 3.5–5.1)
SODIUM: 131 mmol/L — AB (ref 135–145)
Total Bilirubin: 0.8 mg/dL (ref 0.3–1.2)
Total Protein: 7.4 g/dL (ref 6.5–8.1)

## 2016-01-16 LAB — MAGNESIUM: MAGNESIUM: 2.5 mg/dL — AB (ref 1.7–2.4)

## 2016-01-16 LAB — PSA: PSA: 301 ng/mL — AB (ref 0.00–4.00)

## 2016-01-16 MED ORDER — SODIUM CHLORIDE 0.9% FLUSH
10.0000 mL | Freq: Once | INTRAVENOUS | Status: AC
  Administered 2016-01-16: 10 mL via INTRAVENOUS
  Filled 2016-01-16: qty 10

## 2016-01-16 MED ORDER — PEGFILGRASTIM 6 MG/0.6ML ~~LOC~~ PSKT
6.0000 mg | PREFILLED_SYRINGE | Freq: Once | SUBCUTANEOUS | Status: AC
  Administered 2016-01-16: 6 mg via SUBCUTANEOUS
  Filled 2016-01-16: qty 0.6

## 2016-01-16 MED ORDER — SODIUM CHLORIDE 0.9 % IV SOLN
Freq: Once | INTRAVENOUS | Status: AC
  Administered 2016-01-16: 10:00:00 via INTRAVENOUS
  Filled 2016-01-16: qty 5

## 2016-01-16 MED ORDER — LIDOCAINE-PRILOCAINE 2.5-2.5 % EX CREA: TOPICAL_CREAM | CUTANEOUS | Status: AC

## 2016-01-16 MED ORDER — PREDNISONE 5 MG PO TABS: ORAL_TABLET | ORAL | Status: DC

## 2016-01-16 MED ORDER — OXYCODONE HCL 10 MG PO TABS: 10.0000 mg | ORAL_TABLET | Freq: Four times a day (QID) | ORAL | Status: DC | PRN

## 2016-01-16 MED ORDER — HEPARIN SOD (PORK) LOCK FLUSH 100 UNIT/ML IV SOLN
500.0000 [IU] | Freq: Once | INTRAVENOUS | Status: AC
  Administered 2016-01-16: 500 [IU] via INTRAVENOUS
  Filled 2016-01-16: qty 5

## 2016-01-16 MED ORDER — PALONOSETRON HCL INJECTION 0.25 MG/5ML
0.2500 mg | Freq: Once | INTRAVENOUS | Status: AC
  Administered 2016-01-16: 0.25 mg via INTRAVENOUS
  Filled 2016-01-16: qty 5

## 2016-01-16 MED ORDER — FENTANYL 50 MCG/HR TD PT72: 50.0000 ug | MEDICATED_PATCH | TRANSDERMAL | Status: DC

## 2016-01-16 MED ORDER — DOCETAXEL CHEMO INJECTION 160 MG/16ML
70.0000 mg/m2 | Freq: Once | INTRAVENOUS | Status: AC
  Administered 2016-01-16: 130 mg via INTRAVENOUS
  Filled 2016-01-16: qty 13

## 2016-01-16 MED ORDER — SODIUM CHLORIDE 0.9 % IV SOLN
Freq: Once | INTRAVENOUS | Status: AC
  Administered 2016-01-16: 10:00:00 via INTRAVENOUS
  Filled 2016-01-16: qty 1000

## 2016-01-16 NOTE — Progress Notes (Signed)
Patient continues to have cough.  Also states he has no appetite.

## 2016-01-16 NOTE — Progress Notes (Signed)
[place] @ [facility] Telephone:(336) [phone]  Fax:(336) [place]: January 25, 1941  MR#: [id number]  KCL#:[id number]  Patient Care Team: Pcp Not In System as PCP - General  CHIEF COMPLAINT:  Chief Complaint  Patient presents with  . Prostate Cancer   Oncology History   1.MRI scan on August 4 of 2015  revealed in your able liver lesion and multiple areas  of   abnormallity  in the bone suggestive off metastases  2.High PSA. 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  Patient has been started on [facility] (August, 2015) and Delton See, 4.patient started on Alliance protocol with Gillermina Phy nd ZYTIGA (May, 2016)      5.  Progressing disease on Alliance protocol by tumor markers and CT scan criteria 7.  Patient has been started on Taxotere and prednisone from January of 2017  Oncology Flowsheet 10/24/2015 11/14/2015 11/15/2015 11/28/2015 12/05/2015 12/26/2015 01/02/2016  Day, Cycle - Day 1, Cycle 1 - - Day 1, Cycle 2 Day 1, Cycle 3 -  degarelix (FIRMAGON) [place] 80 mg - - 80 mg - - 80 mg  denosumab (XGEVA) [place] 120 mg - - 120 mg - - 120 mg  dexamethasone (DECADRON) IV - [ 12 mg ] - - [ 12 mg ] [ 12 mg ] -  DOCEtaxel (TAXOTERE) IV - 70 mg/m2 - - 70 mg/m2 70 mg/m2 -  fosaprepitant (EMEND) IV - [ 150 mg ] - - [ 150 mg ] [ 150 mg ] -  palonosetron (ALOXI) IV - 0.25 mg - - 0.25 mg 0.25 mg -  pegfilgrastim (NEULASTA ONPRO KIT) [place] - - 6 mg - 6 mg 6 mg -   5.  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    Patient is here for the stage IV carcinoma prostate on Taxotere.  Castration resistant prostate cancer. PSA shows declining trend.  Patient is constipated and has lost approximately 10  pounds of weight in last 3 weeks. According to him appetite is fairly good but because of constipation he has a stomach upset.  His caregiver started bad of MiraLAX and so stopped number which is now working.  No nausea no vomiting no diarrhea no pain     Patient is here for ongoing evaluation and treatment consideration No chills.  No fever.  No nausea.  No vomiting.  No diarrhea.  Appetite is improved.  PSA shows a downward trend.  No tingling numbness Patient is here for ongoing evaluation and consideration of treatment. REVIEW OF SYSTEMS:   ROS GENERAL:  Feels good.  Active.  Appetite is poor and losing weight PERFORMANCE STATUS (ECOG):  01 HEENT:  No visual changes, runny nose, sore throat, mouth sores or tenderness. Lungs: No shortness of breath or cough.  No hemoptysis. Cardiac:  No chest pain, palpitations, orthopnea, or PND. GI: patient has increasing pain in the right upper quadrant. GU:  No urgency, frequency, dysuria, or hematuria. Musculoskeletal:  No back pain.  No joint pain.  No muscle tenderness. Extremities:  No pain or swelling. Skin:  No rashes or skin changes. Neuro:  No headache, numbness or weakness, balance or coordination issues. Endocrine:  No diabetes, thyroid issues, hot flashes  or night sweats. Psych:  No mood changes, depression or anxiety. Pain:  No focal pain. Review of systems:  All other systems reviewed and found to be negative. As per HPI. Otherwise, a complete review of systems is negatve.  PAST MEDICAL HISTORY: Past Medical History  Diagnosis Date  . Hyperlipidemia   . Prostate cancer ([place])     2014  . Bone cancer ([place])     PAST SURGICAL HISTORY: Past Surgical History  Procedure Laterality Date  . Ankle surgery Right   . Peripheral vascular catheterization N/A 11/26/2015    Procedure: Glori Luis Cath Insertion;  Surgeon: Katha Cabal, MD;  Location: [place] [facility];  Service: Cardiovascular;  Laterality: N/A;    FAMILY  HISTORY There is no significant family history of breast cancer, ovarian cancer, colon cancer    HEALTH MAINTENANCE: Social History  Substance Use Topics  . Smoking status: Former Smoker -- 0.50 packs/day for 45 years    Types: Cigarettes    Quit date: 11/13/1991  . Smokeless tobacco: Not on file  . Alcohol Use: 29.4 oz/week    6 Cans of beer, 43 Shots of liquor per week      Allergies  Allergen Reactions  . No Known Allergies      OBJECTIVE: Filed Vitals:   01/16/16 0847  BP: 159/79  Pulse: 109  Temp: 97.1 F (36.2 C)  Resp: 18     Body mass index is 20.85 kg/(m^2).    ECOG FS:1 - Symptomatic but completely ambulatory  Physical Exam  GENERAL:  Well developed, well nourished, sitting comfortably in the exam room in no acute distress. MENTAL STATUS:  Alert and oriented to person, place and time. Alopecia  .  No evidence of stomatitis. ENT:  Oropharynx clear without lesion.  Tongue normal. Mucous membranes moist.  RESPIRATORY:  Clear to auscultation without rales, wheezes or rhonchi. CARDIOVASCULAR:  Regular rate and rhythm without murmur, rub or gallop. BREAST:  Right breast without masses, skin changes or nipple discharge.  Left breast without masses, skin changes or nipple discharge. ABDOMEN:  Soft, non-tender, with active bowel sounds, and no hepatosplenomegaly.  No masses. BACK:  No CVA tenderness.  No tenderness on percussion of the back or rib cage. SKIN:  No rashes, ulcers or lesions. EXTREMITIES: No edema, no skin discoloration or tenderness.  No palpable cords. LYMPH NODES: No palpable cervical, supraclavicular, axillary or inguinal adenopathy  NEUROLOGICAL: Unremarkable. PSYCH:  Appropriate... LAB RESULTS:          No results found for: SPEP, UPEP  Lab Results  Component Value Date   WBC 12.8* 01/16/2016   NEUTROABS 9.2* 01/16/2016   HGB 9.4* 01/16/2016   HCT 28.8* 01/16/2016   MCV 87.2 01/16/2016   PLT 330 01/16/2016      Chemistry      STUDIES: No results found.  ASSESSMENT:  Stage IV carcinoma prostate.  Castration resistant prostate cancer . Marland Kitchen  On state patient with significant weight loss Constipation is due to opium induce patient was instructed all regarding constipation.  MiraLAX to stools soft no and if needed Senokot-S can maintain to reduced. Continue prednisone PSA is pending.  Last PSA was 218 which shows downward trend All of the lab data has been reviewed liver enzymes are improving Continue chemotherapy along with Neulasta [place] is well controlled continue fentanyl patch 50 g and oxycodone for breakthrough pain medication Anemia is multifactorial and will be watched carefully       Staging form: Prostate,  AJCC 7th Edition     Clinical: Stage IV (T3, N0, M1b, PSA: 20 or greater, Gleason 8-10 - Poorly differentiated/undifferentiated (marked anaplasia)) - Signed by Forest Gleason, MD on 03/17/2015   Forest Gleason, MD   01/16/2016 9:17 AM          Smoking History: Smoking History 1(1)Packs per day and Quit 1993 after 30 yr history.(1)                         )     Advance Directive:  Advance Directive ([place]) yes(1)   Do you want to revise or change your advance directive? No(1)                                 )           [place] @ [facility]                           )

## 2016-01-30 ENCOUNTER — Inpatient Hospital Stay: Payer: Medicare Other

## 2016-01-30 DIAGNOSIS — C61 Malignant neoplasm of prostate: Secondary | ICD-10-CM

## 2016-01-30 DIAGNOSIS — Z5111 Encounter for antineoplastic chemotherapy: Secondary | ICD-10-CM | POA: Diagnosis not present

## 2016-01-30 LAB — COMPREHENSIVE METABOLIC PANEL
ALT: 18 U/L (ref 17–63)
AST: 23 U/L (ref 15–41)
Albumin: 3.6 g/dL (ref 3.5–5.0)
Alkaline Phosphatase: 142 U/L — ABNORMAL HIGH (ref 38–126)
Anion gap: 6 (ref 5–15)
BILIRUBIN TOTAL: 0.5 mg/dL (ref 0.3–1.2)
BUN: 21 mg/dL — AB (ref 6–20)
CO2: 25 mmol/L (ref 22–32)
CREATININE: 0.99 mg/dL (ref 0.61–1.24)
Calcium: 8.4 mg/dL — ABNORMAL LOW (ref 8.9–10.3)
Chloride: 102 mmol/L (ref 101–111)
GFR calc Af Amer: >60 mL/min (ref >60)
Glucose, Bld: 106 mg/dL — ABNORMAL HIGH (ref 65–99)
Potassium: 4.9 mmol/L (ref 3.5–5.1)
Sodium: 133 mmol/L — ABNORMAL LOW (ref 135–145)
TOTAL PROTEIN: 7.2 g/dL (ref 6.5–8.1)

## 2016-01-30 LAB — CBC WITH DIFFERENTIAL/PLATELET
BASOS ABS: 0.1 10*3/uL (ref 0–0.1)
Basophils Relative: 0 %
Eosinophils Absolute: 0 10*3/uL (ref 0–0.7)
Eosinophils Relative: 0 %
HCT: 25.9 % — ABNORMAL LOW (ref 40.0–52.0)
Hemoglobin: 8.2 g/dL — ABNORMAL LOW (ref 13.0–18.0)
LYMPHS ABS: 2.6 10*3/uL (ref 1.0–3.6)
LYMPHS PCT: 14 %
MCH: 28.5 pg (ref 26.0–34.0)
MCHC: 31.8 g/dL — ABNORMAL LOW (ref 32.0–36.0)
MCV: 89.5 fL (ref 80.0–100.0)
MONO ABS: 0.9 10*3/uL (ref 0.2–1.0)
Monocytes Relative: 5 %
NEUTROS ABS: 15.1 10*3/uL — AB (ref 1.4–6.5)
NEUTROS PCT: 81 %
Platelets: 181 10*3/uL (ref 150–440)
RBC: 2.89 MIL/uL — ABNORMAL LOW (ref 4.40–5.90)
RDW: 22.5 % — AB (ref 11.5–14.5)
WBC: 18.6 10*3/uL — AB (ref 3.8–10.6)

## 2016-01-30 MED ORDER — DEGARELIX ACETATE 80 MG ~~LOC~~ SOLR
80.0000 mg | Freq: Once | SUBCUTANEOUS | Status: AC
  Administered 2016-01-30: 80 mg via SUBCUTANEOUS
  Filled 2016-01-30: qty 4

## 2016-01-30 MED ORDER — DENOSUMAB 120 MG/1.7ML ~~LOC~~ SOLN
120.0000 mg | Freq: Once | SUBCUTANEOUS | Status: AC
  Administered 2016-01-30: 120 mg via SUBCUTANEOUS
  Filled 2016-01-30: qty 1.7

## 2016-01-31 LAB — PSA: PSA: 148.76 ng/mL — ABNORMAL HIGH (ref 0.00–4.00)

## 2016-02-03 ENCOUNTER — Other Ambulatory Visit: Payer: Self-pay | Admitting: *Deleted

## 2016-02-03 ENCOUNTER — Inpatient Hospital Stay (HOSPITAL_BASED_OUTPATIENT_CLINIC_OR_DEPARTMENT_OTHER): Payer: Medicare Other | Admitting: Oncology

## 2016-02-03 ENCOUNTER — Encounter: Payer: Self-pay | Admitting: Oncology

## 2016-02-03 ENCOUNTER — Inpatient Hospital Stay: Payer: Medicare Other

## 2016-02-03 VITALS — BP 124/71 | HR 81 | Temp 96.3°F | Resp 18 | Wt 155.5 lb

## 2016-02-03 DIAGNOSIS — Z79899 Other long term (current) drug therapy: Secondary | ICD-10-CM

## 2016-02-03 DIAGNOSIS — C787 Secondary malignant neoplasm of liver and intrahepatic bile duct: Secondary | ICD-10-CM | POA: Diagnosis not present

## 2016-02-03 DIAGNOSIS — C7951 Secondary malignant neoplasm of bone: Secondary | ICD-10-CM | POA: Diagnosis not present

## 2016-02-03 DIAGNOSIS — C61 Malignant neoplasm of prostate: Secondary | ICD-10-CM

## 2016-02-03 DIAGNOSIS — Z5111 Encounter for antineoplastic chemotherapy: Secondary | ICD-10-CM | POA: Diagnosis not present

## 2016-02-03 LAB — CBC WITH DIFFERENTIAL/PLATELET
Basophils Absolute: 0.1 10*3/uL (ref 0–0.1)
Basophils Relative: 1 %
Eosinophils Absolute: 0 10*3/uL (ref 0–0.7)
Eosinophils Relative: 0 %
HEMATOCRIT: 24.2 % — AB (ref 40.0–52.0)
HEMOGLOBIN: 7.9 g/dL — AB (ref 13.0–18.0)
LYMPHS ABS: 2.6 10*3/uL (ref 1.0–3.6)
LYMPHS PCT: 27 %
MCH: 29.1 pg (ref 26.0–34.0)
MCHC: 32.6 g/dL (ref 32.0–36.0)
MCV: 89.2 fL (ref 80.0–100.0)
MONO ABS: 1.2 10*3/uL — AB (ref 0.2–1.0)
MONOS PCT: 12 %
NEUTROS ABS: 5.8 10*3/uL (ref 1.4–6.5)
NEUTROS PCT: 60 %
Platelets: 242 10*3/uL (ref 150–440)
RBC: 2.71 MIL/uL — ABNORMAL LOW (ref 4.40–5.90)
RDW: 22.7 % — AB (ref 11.5–14.5)
WBC: 9.6 10*3/uL (ref 3.8–10.6)

## 2016-02-03 LAB — COMPREHENSIVE METABOLIC PANEL
ALBUMIN: 3.4 g/dL — AB (ref 3.5–5.0)
ALK PHOS: 116 U/L (ref 38–126)
ALT: 14 U/L — ABNORMAL LOW (ref 17–63)
ANION GAP: 4 — AB (ref 5–15)
AST: 17 U/L (ref 15–41)
BILIRUBIN TOTAL: 0.4 mg/dL (ref 0.3–1.2)
BUN: 20 mg/dL (ref 6–20)
CALCIUM: 7.7 mg/dL — AB (ref 8.9–10.3)
CO2: 25 mmol/L (ref 22–32)
Chloride: 102 mmol/L (ref 101–111)
Creatinine, Ser: 1.07 mg/dL (ref 0.61–1.24)
GLUCOSE: 101 mg/dL — AB (ref 65–99)
Potassium: 3.9 mmol/L (ref 3.5–5.1)
Sodium: 131 mmol/L — ABNORMAL LOW (ref 135–145)
TOTAL PROTEIN: 7.2 g/dL (ref 6.5–8.1)

## 2016-02-03 MED ORDER — OXYCODONE HCL 10 MG PO TABS: 10.0000 mg | ORAL_TABLET | Freq: Four times a day (QID) | ORAL | Status: DC | PRN

## 2016-02-03 MED ORDER — FENTANYL 50 MCG/HR TD PT72: 50.0000 ug | MEDICATED_PATCH | TRANSDERMAL | Status: DC

## 2016-02-03 NOTE — Progress Notes (Signed)
[place] @ [facility] Telephone:(336) [phone]  Fax:(336) [place]: 1941-10-11  MR#: [id number]  PFX#:[id number]  Patient Care Team: Pcp Not In System as PCP - General  CHIEF COMPLAINT:  Chief Complaint  Patient presents with  . Prostate Cancer   Oncology History   1.MRI scan on August 4 of 2015  revealed in your able liver lesion and multiple areas  of   abnormallity  in the bone suggestive off metastases  2.High PSA. 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  Patient has been started on [facility] (August, 2015) and Delton See, 4.patient started on [facility] protocol with Gillermina Phy nd ZYTIGA (May, 2016)      5.  Progressing disease on [facility] protocol by tumor markers and CT scan criteria 7.  Patient has been started on Taxotere and prednisone from January of 2017  Oncology Flowsheet 11/15/2015 11/28/2015 12/05/2015 12/26/2015 01/02/2016 01/16/2016 01/30/2016  Day, Cycle - - Day 1, Cycle 2 Day 1, Cycle 3 - Day 1, Cycle 4 -  degarelix (FIRMAGON) [place] - 80 mg - - 80 mg - 80 mg  denosumab (XGEVA) [place] - 120 mg - - 120 mg - 120 mg  dexamethasone (DECADRON) IV - - [ 12 mg ] [ 12 mg ] - [ 12 mg ] -  DOCEtaxel (TAXOTERE) IV - - 70 mg/m2 70 mg/m2 - 70 mg/m2 -  fosaprepitant (EMEND) IV - - [ 150 mg ] [ 150 mg ] - [ 150 mg ] -  palonosetron (ALOXI) IV - - 0.25 mg 0.25 mg - 0.25 mg -  pegfilgrastim (NEULASTA ONPRO KIT)  6 mg - 6 mg 6 mg - 6 mg -   5.  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    Patient is here for the stage IV carcinoma prostate on Taxotere.  Castration resistant prostate cancer. PSA shows declining trend.   Patient is here for ongoing evaluation and  treatment consideration.  appetite has been stable.  No bony pains     Patient is here for ongoing evaluation and treatment consideration No chills.  No fever.  No nausea.  No vomiting.  No diarrhea.  Appetite is improved.  PSA shows a downward trend.  No tingling numbness Patient is here for ongoing evaluation and consideration of treatment. REVIEW OF SYSTEMS:   ROS GENERAL:  Feels good.  Active.  Appetite is poor and losing weight PERFORMANCE STATUS (ECOG):  01 HEENT:  No visual changes, runny nose, sore throat, mouth sores or tenderness. Lungs: No shortness of breath or cough.  No hemoptysis. Cardiac:  No chest pain, palpitations, orthopnea, or PND. GI: patient has increasing pain in the right upper quadrant. GU:  No urgency, frequency, dysuria, or hematuria. Musculoskeletal:  No back pain.  No joint pain.  No muscle tenderness. Extremities:  No pain or swelling. Skin:  No rashes or skin changes. Neuro:  No headache, numbness or weakness, balance or coordination issues. Endocrine:  No diabetes, thyroid issues, hot flashes or night sweats. Psych:  No mood changes, depression or anxiety. Pain:  No focal pain. Review of systems:  All other systems reviewed and found to be negative. As per HPI. Otherwise, a complete  review of systems is negatve.  PAST MEDICAL HISTORY: Past Medical History  Diagnosis Date  . Hyperlipidemia   . Prostate cancer ([place])     2014  . Bone cancer ([place])     PAST SURGICAL HISTORY: Past Surgical History  Procedure Laterality Date  . Ankle surgery Right   . Peripheral vascular catheterization N/A 11/26/2015    Procedure: Glori Luis Cath Insertion;  Surgeon: Katha Cabal, MD;  Location: [place] [facility];  Service: Cardiovascular;  Laterality: N/A;    FAMILY HISTORY There is no significant family history of breast cancer, ovarian cancer, colon cancer    HEALTH MAINTENANCE: Social History  Substance Use Topics  . Smoking status: Former Smoker --  0.50 packs/day for 45 years    Types: Cigarettes    Quit date: 11/13/1991  . Smokeless tobacco: None  . Alcohol Use: 29.4 oz/week    6 Cans of beer, 43 Shots of liquor per week      Allergies  Allergen Reactions  . No Known Allergies      OBJECTIVE: Filed Vitals:   02/03/16 1146  BP: 124/71  Pulse: 81  Temp: 96.3 F (35.7 C)  Resp: 18     Body mass index is 22.31 kg/(m^2).    ECOG FS:1 - Symptomatic but completely ambulatory  Physical Exam  GENERAL:  Well developed, well nourished, sitting comfortably in the exam room in no acute distress. MENTAL STATUS:  Alert and oriented to person, place and time. Alopecia  .  No evidence of stomatitis. ENT:  Oropharynx clear without lesion.  Tongue normal. Mucous membranes moist.  RESPIRATORY:  Clear to auscultation without rales, wheezes or rhonchi. CARDIOVASCULAR:  Regular rate and rhythm without murmur, rub or gallop. BREAST:  Right breast without masses, skin changes or nipple discharge.  Left breast without masses, skin changes or nipple discharge. ABDOMEN:  Soft, non-tender, with active bowel sounds, and no hepatosplenomegaly.  No masses. BACK:  No CVA tenderness.  No tenderness on percussion of the back or rib cage. SKIN:  No rashes, ulcers or lesions. EXTREMITIES: No edema, no skin discoloration or tenderness.  No palpable cords. LYMPH NODES: No palpable cervical, supraclavicular, axillary or inguinal adenopathy  NEUROLOGICAL: Unremarkable. PSYCH:  Appropriate... LAB RESULTS:          No results found for: SPEP, UPEP  Lab Results  Component Value Date   WBC 9.6 02/03/2016   NEUTROABS 5.8 02/03/2016   HGB 7.9* 02/03/2016   HCT 24.2* 02/03/2016   MCV 89.2 02/03/2016   PLT 242 02/03/2016      Chemistry     STUDIES: No results found.  ASSESSMENT:  Stage IV carcinoma prostate.  Castration resistant prostate cancer . Marland Kitchen  Patient  continues to lose weight PSA continues to show declining trend Continue  Taxotere with Neulasta support  Overall patient is responding to the treatment     Staging form: Prostate, AJCC 7th Edition     Clinical: Stage IV (T3, N0, M1b, PSA: 20 or greater, Gleason 8-10 - Poorly differentiated/undifferentiated (marked anaplasia)) - Signed by Forest Gleason, MD on 03/17/2015   Forest Gleason, MD   02/03/2016 12:27 PM          Smoking History: Smoking History 1(1)Packs per day and Quit 1993 after 30 yr history.(1)                         )     Advance Directive:  Advance Directive Theatre stage manager)  yes(1)   Do you want to revise or change your advance directive? No(1)                                 )           [place] @ [facility]                           )           [place] @ [facility] Telephone:(336) [phone]  Fax:(336) [place]: 1940-12-20  MR#: [id number]  BHA#:[id number]  Patient Care Team: Pcp Not In System as PCP - General  CHIEF COMPLAINT:  Chief Complaint  Patient presents with  . Prostate Cancer   Oncology History   1.MRI scan on August 4 of 2015  revealed in your able liver lesion and multiple areas  of   abnormallity  in the bone suggestive off metastases  2.High PSA. 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  Patient has been started on [facility] (August, 2015) and Delton See, 4.patient started on [facility] protocol with Gillermina Phy nd ZYTIGA (May, 2016)      5.  Progressing disease on Alliance protocol by tumor markers and CT scan criteria 7.  Patient has been started on Taxotere and prednisone from January of 2017  Oncology Flowsheet 11/15/2015 11/28/2015 12/05/2015 12/26/2015 01/02/2016 01/16/2016 01/30/2016  Day, Cycle - - Day 1, Cycle 2 Day 1, Cycle 3 - Day 1, Cycle 4 -  degarelix (FIRMAGON) [place] - 80 mg - - 80 mg - 80 mg  denosumab (XGEVA) [place] - 120 mg  - - 120 mg - 120 mg  dexamethasone (DECADRON) IV - - [ 12 mg ] [ 12 mg ] - [ 12 mg ] -  DOCEtaxel (TAXOTERE) IV - - 70 mg/m2 70 mg/m2 - 70 mg/m2 -  fosaprepitant (EMEND) IV - - [ 150 mg ] [ 150 mg ] - [ 150 mg ] -  palonosetron (ALOXI) IV - - 0.25 mg 0.25 mg - 0.25 mg -  pegfilgrastim (NEULASTA ONPRO KIT) [place] 6 mg - 6 mg 6 mg - 6 mg -   5.  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    Patient is here for the stage IV carcinoma prostate on Taxotere.  Castration resistant prostate cancer. PSA shows declining trend.  Patient is constipated and has lost approximately 10 pounds of weight in last 3 weeks. According to him appetite is fairly good but because of constipation he has a stomach upset.  His caregiver started bad of MiraLAX and so stopped number which is now working.  No nausea no vomiting no diarrhea no pain     Patient is here for ongoing evaluation and treatment consideration No chills.  No fever.  No nausea.  No vomiting.  No diarrhea.  Appetite is improved.  PSA shows a downward trend.  No tingling numbness Patient is here for ongoing evaluation and consideration of treatment. REVIEW OF SYSTEMS:   ROS GENERAL:  Feels good.  Active.  Appetite is poor and losing weight PERFORMANCE STATUS (ECOG):  01 HEENT:  No visual changes, runny nose, sore throat, mouth sores or tenderness. Lungs: No shortness of breath or cough.  No hemoptysis. Cardiac:  No chest pain, palpitations, orthopnea, or PND. GI: patient has increasing pain in the right upper quadrant. GU:  No urgency, frequency, dysuria, or hematuria. Musculoskeletal:  No back pain.  No joint pain.  No muscle tenderness. Extremities:  No pain or swelling. Skin:  No rashes or skin changes. Neuro:  No headache, numbness or  weakness, balance or coordination issues. Endocrine:  No diabetes, thyroid issues, hot flashes or night sweats. Psych:  No mood changes, depression or anxiety. Pain:  No focal pain. Review of systems:  All other systems reviewed and found to be negative. As per HPI. Otherwise, a complete review of systems is negatve.  PAST MEDICAL HISTORY: Past Medical History  Diagnosis Date  . Hyperlipidemia   . Prostate cancer ([place])     2014  . Bone cancer ([place])     PAST SURGICAL HISTORY: Past Surgical History  Procedure Laterality Date  . Ankle surgery Right   . Peripheral vascular catheterization N/A 11/26/2015    Procedure: Glori Luis Cath Insertion;  Surgeon: Katha Cabal, MD;  Location: [place] [facility];  Service: Cardiovascular;  Laterality: N/A;    FAMILY HISTORY There is no significant family history of breast cancer, ovarian cancer, colon cancer    HEALTH MAINTENANCE: Social History  Substance Use Topics  . Smoking status: Former Smoker -- 0.50 packs/day for 45 years    Types: Cigarettes    Quit date: 11/13/1991  . Smokeless tobacco: None  . Alcohol Use: 29.4 oz/week    6 Cans of beer, 43 Shots of liquor per week      Allergies  Allergen Reactions  . No Known Allergies      OBJECTIVE: Filed Vitals:   02/03/16 1146  BP: 124/71  Pulse: 81  Temp: 96.3 F (35.7 C)  Resp: 18     Body mass index is 22.31 kg/(m^2).    ECOG FS:1 - Symptomatic but completely ambulatory  Physical Exam  GENERAL:  Well developed, well nourished, sitting comfortably in the exam room in no acute distress. MENTAL STATUS:  Alert and oriented to person, place and time. Alopecia  .  No evidence of stomatitis. ENT:  Oropharynx clear without lesion.  Tongue normal. Mucous membranes moist.  RESPIRATORY:  Clear to auscultation without rales, wheezes or rhonchi. CARDIOVASCULAR:  Regular rate and rhythm without murmur, rub or gallop. BREAST:  Right breast without masses, skin changes or nipple  discharge.  Left breast without masses, skin changes or nipple discharge. ABDOMEN:  Soft, non-tender, with active bowel sounds, and no hepatosplenomegaly.  No masses. BACK:  No CVA tenderness.  No tenderness on percussion of the back or rib cage. SKIN:  No rashes, ulcers or lesions. EXTREMITIES: No edema, no skin discoloration or tenderness.  No palpable cords. LYMPH NODES: No palpable cervical, supraclavicular, axillary or inguinal adenopathy  NEUROLOGICAL: Unremarkable. PSYCH:  Appropriate... LAB RESULTS:          No results found for: SPEP, UPEP  Lab Results  Component Value Date   WBC 9.6 02/03/2016   NEUTROABS 5.8 02/03/2016   HGB 7.9* 02/03/2016   HCT 24.2* 02/03/2016   MCV 89.2 02/03/2016   PLT 242 02/03/2016      Chemistry     STUDIES: No results found.  ASSESSMENT:  Stage IV carcinoma prostate.  Castration resistant prostate cancer . Marland Kitchen  On state  patient with significant weight loss Constipation is due to opium induce patient was instructed all regarding constipation.  MiraLAX to stools soft no and if needed Senokot-S can maintain to reduced. Continue prednisone PSA is pending.  Last PSA was 218 which shows downward trend All of the lab data has been reviewed liver enzymes are improving Continue chemotherapy along with Neulasta [place] is well controlled continue fentanyl patch 50 g and oxycodone for breakthrough pain medication Anemia is multifactorial and will be watched carefully       Staging form: Prostate, AJCC 7th Edition     Clinical: Stage IV (T3, N0, M1b, PSA: 20 or greater, Gleason 8-10 - Poorly differentiated/undifferentiated (marked anaplasia)) - Signed by Forest Gleason, MD on 03/17/2015   Forest Gleason, MD   02/03/2016 10:26 PM          Smoking History: Smoking History 1(1)Packs per day and Quit 1993 after 30 yr history.(1)                         )     Advance Directive:  Advance Directive ([place])  yes(1)   Do you want to revise or change your advance directive? No(1)                                 )           [place] @ [facility]                           )

## 2016-02-06 ENCOUNTER — Inpatient Hospital Stay: Payer: Medicare Other

## 2016-02-06 VITALS — BP 120/72 | HR 72 | Temp 98.5°F | Resp 20

## 2016-02-06 DIAGNOSIS — C801 Malignant (primary) neoplasm, unspecified: Secondary | ICD-10-CM

## 2016-02-06 DIAGNOSIS — C61 Malignant neoplasm of prostate: Secondary | ICD-10-CM

## 2016-02-06 DIAGNOSIS — Z5111 Encounter for antineoplastic chemotherapy: Secondary | ICD-10-CM | POA: Diagnosis not present

## 2016-02-06 MED ORDER — SODIUM CHLORIDE 0.9% FLUSH
10.0000 mL | INTRAVENOUS | Status: DC | PRN
  Administered 2016-02-06: 10 mL via INTRAVENOUS
  Filled 2016-02-06: qty 10

## 2016-02-06 MED ORDER — HEPARIN SOD (PORK) LOCK FLUSH 100 UNIT/ML IV SOLN
500.0000 [IU] | Freq: Once | INTRAVENOUS | Status: AC | PRN
  Administered 2016-02-06: 500 [IU]

## 2016-02-06 MED ORDER — SODIUM CHLORIDE 0.9 % IJ SOLN
10.0000 mL | INTRAMUSCULAR | Status: DC | PRN
  Filled 2016-02-06: qty 10

## 2016-02-06 MED ORDER — SODIUM CHLORIDE 0.9 % IV SOLN
Freq: Once | INTRAVENOUS | Status: AC
  Administered 2016-02-06: 09:00:00 via INTRAVENOUS
  Filled 2016-02-06: qty 1000

## 2016-02-06 MED ORDER — DOCETAXEL CHEMO INJECTION 160 MG/16ML
70.0000 mg/m2 | Freq: Once | INTRAVENOUS | Status: AC
  Administered 2016-02-06: 130 mg via INTRAVENOUS
  Filled 2016-02-06: qty 13

## 2016-02-06 MED ORDER — PEGFILGRASTIM 6 MG/0.6ML ~~LOC~~ PSKT
6.0000 mg | PREFILLED_SYRINGE | Freq: Once | SUBCUTANEOUS | Status: AC
  Administered 2016-02-06: 6 mg via SUBCUTANEOUS
  Filled 2016-02-06: qty 0.6

## 2016-02-06 MED ORDER — PALONOSETRON HCL INJECTION 0.25 MG/5ML
0.2500 mg | Freq: Once | INTRAVENOUS | Status: AC
  Administered 2016-02-06: 0.25 mg via INTRAVENOUS
  Filled 2016-02-06: qty 5

## 2016-02-06 MED ORDER — SODIUM CHLORIDE 0.9 % IV SOLN
Freq: Once | INTRAVENOUS | Status: AC
  Administered 2016-02-06: 10:00:00 via INTRAVENOUS
  Filled 2016-02-06: qty 5

## 2016-02-06 MED ORDER — HEPARIN SOD (PORK) LOCK FLUSH 100 UNIT/ML IV SOLN
500.0000 [IU] | Freq: Once | INTRAVENOUS | Status: AC
  Filled 2016-02-06: qty 5

## 2016-02-06 NOTE — Progress Notes (Signed)
hgb =7.9 per MD notes on 3/27:  Anemia is multifactorial and will be watched carefully      Staging form: Prostate, AJCC 7th Edition  Clinical: Stage IV (T3, N0, M1b, PSA: 20 or greater, Gleason 8-10 - Poorly differentiated/undifferentiated (marked anaplasia)) - Signed by Forest Gleason, MD on 03/17/2015

## 2016-02-13 ENCOUNTER — Inpatient Hospital Stay: Payer: Medicare Other | Attending: Oncology

## 2016-02-13 DIAGNOSIS — Z79899 Other long term (current) drug therapy: Secondary | ICD-10-CM | POA: Diagnosis not present

## 2016-02-13 DIAGNOSIS — Z5111 Encounter for antineoplastic chemotherapy: Secondary | ICD-10-CM | POA: Insufficient documentation

## 2016-02-13 DIAGNOSIS — Z87891 Personal history of nicotine dependence: Secondary | ICD-10-CM | POA: Diagnosis not present

## 2016-02-13 DIAGNOSIS — C61 Malignant neoplasm of prostate: Secondary | ICD-10-CM | POA: Diagnosis not present

## 2016-02-13 DIAGNOSIS — R9721 Rising PSA following treatment for malignant neoplasm of prostate: Secondary | ICD-10-CM | POA: Insufficient documentation

## 2016-02-13 DIAGNOSIS — Z7689 Persons encountering health services in other specified circumstances: Secondary | ICD-10-CM | POA: Diagnosis not present

## 2016-02-13 DIAGNOSIS — E785 Hyperlipidemia, unspecified: Secondary | ICD-10-CM | POA: Diagnosis not present

## 2016-02-13 DIAGNOSIS — C787 Secondary malignant neoplasm of liver and intrahepatic bile duct: Secondary | ICD-10-CM | POA: Diagnosis not present

## 2016-02-13 LAB — CBC WITH DIFFERENTIAL/PLATELET
BASOS ABS: 0.1 10*3/uL (ref 0–0.1)
Basophils Relative: 1 %
Eosinophils Absolute: 0.1 10*3/uL (ref 0–0.7)
Eosinophils Relative: 0 %
HEMATOCRIT: 27.5 % — AB (ref 40.0–52.0)
HEMOGLOBIN: 8.8 g/dL — AB (ref 13.0–18.0)
LYMPHS PCT: 15 %
Lymphs Abs: 3.5 10*3/uL (ref 1.0–3.6)
MCH: 28.1 pg (ref 26.0–34.0)
MCHC: 32 g/dL (ref 32.0–36.0)
MCV: 88 fL (ref 80.0–100.0)
MONO ABS: 1.8 10*3/uL — AB (ref 0.2–1.0)
MONOS PCT: 8 %
NEUTROS ABS: 18.4 10*3/uL — AB (ref 1.4–6.5)
NEUTROS PCT: 76 %
Platelets: 255 10*3/uL (ref 150–440)
RBC: 3.12 MIL/uL — ABNORMAL LOW (ref 4.40–5.90)
RDW: 22.1 % — AB (ref 11.5–14.5)
WBC: 24 10*3/uL — ABNORMAL HIGH (ref 3.8–10.6)

## 2016-02-20 ENCOUNTER — Inpatient Hospital Stay: Payer: Medicare Other

## 2016-02-20 DIAGNOSIS — C61 Malignant neoplasm of prostate: Secondary | ICD-10-CM

## 2016-02-20 DIAGNOSIS — Z5111 Encounter for antineoplastic chemotherapy: Secondary | ICD-10-CM | POA: Diagnosis not present

## 2016-02-20 LAB — CBC WITH DIFFERENTIAL/PLATELET
Basophils Absolute: 0 10*3/uL (ref 0–0.1)
Basophils Relative: 0 %
Eosinophils Absolute: 0 10*3/uL (ref 0–0.7)
Eosinophils Relative: 0 %
HCT: 29.6 % — ABNORMAL LOW (ref 40.0–52.0)
Hemoglobin: 9.4 g/dL — ABNORMAL LOW (ref 13.0–18.0)
LYMPHS ABS: 2.7 10*3/uL (ref 1.0–3.6)
LYMPHS PCT: 14 %
MCH: 28.2 pg (ref 26.0–34.0)
MCHC: 31.9 g/dL — ABNORMAL LOW (ref 32.0–36.0)
MCV: 88.5 fL (ref 80.0–100.0)
Monocytes Absolute: 1.1 10*3/uL — ABNORMAL HIGH (ref 0.2–1.0)
Monocytes Relative: 6 %
NEUTROS PCT: 80 %
Neutro Abs: 15.2 10*3/uL — ABNORMAL HIGH (ref 1.4–6.5)
Platelets: 220 10*3/uL (ref 150–440)
RBC: 3.35 MIL/uL — AB (ref 4.40–5.90)
RDW: 22.1 % — ABNORMAL HIGH (ref 11.5–14.5)
WBC: 19.1 10*3/uL — AB (ref 3.8–10.6)

## 2016-02-27 ENCOUNTER — Inpatient Hospital Stay: Payer: Medicare Other

## 2016-02-27 ENCOUNTER — Encounter: Payer: Self-pay | Admitting: Oncology

## 2016-02-27 ENCOUNTER — Inpatient Hospital Stay (HOSPITAL_BASED_OUTPATIENT_CLINIC_OR_DEPARTMENT_OTHER): Payer: Medicare Other | Admitting: Oncology

## 2016-02-27 VITALS — HR 76

## 2016-02-27 VITALS — BP 161/68 | HR 43 | Temp 97.9°F | Resp 18 | Wt 152.1 lb

## 2016-02-27 DIAGNOSIS — C61 Malignant neoplasm of prostate: Secondary | ICD-10-CM

## 2016-02-27 DIAGNOSIS — C787 Secondary malignant neoplasm of liver and intrahepatic bile duct: Secondary | ICD-10-CM | POA: Diagnosis not present

## 2016-02-27 DIAGNOSIS — R9721 Rising PSA following treatment for malignant neoplasm of prostate: Secondary | ICD-10-CM | POA: Diagnosis not present

## 2016-02-27 DIAGNOSIS — Z79899 Other long term (current) drug therapy: Secondary | ICD-10-CM

## 2016-02-27 DIAGNOSIS — Z5111 Encounter for antineoplastic chemotherapy: Secondary | ICD-10-CM | POA: Diagnosis not present

## 2016-02-27 LAB — CBC WITH DIFFERENTIAL/PLATELET
Basophils Absolute: 0 10*3/uL (ref 0–0.1)
Basophils Relative: 0 %
EOS ABS: 0 10*3/uL (ref 0–0.7)
EOS PCT: 0 %
HCT: 25.9 % — ABNORMAL LOW (ref 40.0–52.0)
Hemoglobin: 8.5 g/dL — ABNORMAL LOW (ref 13.0–18.0)
LYMPHS ABS: 1.7 10*3/uL (ref 1.0–3.6)
Lymphocytes Relative: 17 %
MCH: 28.9 pg (ref 26.0–34.0)
MCHC: 32.7 g/dL (ref 32.0–36.0)
MCV: 88.3 fL (ref 80.0–100.0)
MONOS PCT: 12 %
Monocytes Absolute: 1.2 10*3/uL — ABNORMAL HIGH (ref 0.2–1.0)
Neutro Abs: 7 10*3/uL — ABNORMAL HIGH (ref 1.4–6.5)
Neutrophils Relative %: 71 %
PLATELETS: 307 10*3/uL (ref 150–440)
RBC: 2.93 MIL/uL — ABNORMAL LOW (ref 4.40–5.90)
RDW: 22 % — ABNORMAL HIGH (ref 11.5–14.5)
WBC: 10 10*3/uL (ref 3.8–10.6)

## 2016-02-27 LAB — COMPREHENSIVE METABOLIC PANEL
ALK PHOS: 104 U/L (ref 38–126)
ALT: 15 U/L — ABNORMAL LOW (ref 17–63)
ANION GAP: 2 — AB (ref 5–15)
AST: 22 U/L (ref 15–41)
Albumin: 3.5 g/dL (ref 3.5–5.0)
BUN: 19 mg/dL (ref 6–20)
CALCIUM: 8.3 mg/dL — AB (ref 8.9–10.3)
CHLORIDE: 108 mmol/L (ref 101–111)
CO2: 26 mmol/L (ref 22–32)
Creatinine, Ser: 1.02 mg/dL (ref 0.61–1.24)
Glucose, Bld: 105 mg/dL — ABNORMAL HIGH (ref 65–99)
Potassium: 4.2 mmol/L (ref 3.5–5.1)
SODIUM: 136 mmol/L (ref 135–145)
Total Bilirubin: 0.3 mg/dL (ref 0.3–1.2)
Total Protein: 6.7 g/dL (ref 6.5–8.1)

## 2016-02-27 LAB — FERRITIN: FERRITIN: 500 ng/mL — AB (ref 24–336)

## 2016-02-27 LAB — IRON AND TIBC
Iron: 29 ug/dL — ABNORMAL LOW (ref 45–182)
SATURATION RATIOS: 12 % — AB (ref 17.9–39.5)
TIBC: 251 ug/dL (ref 250–450)
UIBC: 222 ug/dL

## 2016-02-27 LAB — MAGNESIUM: MAGNESIUM: 2.2 mg/dL (ref 1.7–2.4)

## 2016-02-27 LAB — PSA: PSA: 152.31 ng/mL — AB (ref 0.00–4.00)

## 2016-02-27 LAB — SAMPLE TO BLOOD BANK

## 2016-02-27 MED ORDER — SODIUM CHLORIDE 0.9 % IJ SOLN
10.0000 mL | INTRAMUSCULAR | Status: DC | PRN
  Administered 2016-02-27: 10 mL
  Filled 2016-02-27: qty 10

## 2016-02-27 MED ORDER — HEPARIN SOD (PORK) LOCK FLUSH 100 UNIT/ML IV SOLN
500.0000 [IU] | Freq: Once | INTRAVENOUS | Status: AC | PRN
Start: 2016-02-27 — End: 2016-02-27
  Administered 2016-02-27: 500 [IU]
  Filled 2016-02-27: qty 5

## 2016-02-27 MED ORDER — DEGARELIX ACETATE 80 MG ~~LOC~~ SOLR
80.0000 mg | Freq: Once | SUBCUTANEOUS | Status: AC
  Administered 2016-02-27: 80 mg via SUBCUTANEOUS
  Filled 2016-02-27: qty 4

## 2016-02-27 MED ORDER — PALONOSETRON HCL INJECTION 0.25 MG/5ML
0.2500 mg | Freq: Once | INTRAVENOUS | Status: AC
  Administered 2016-02-27: 0.25 mg via INTRAVENOUS
  Filled 2016-02-27: qty 5

## 2016-02-27 MED ORDER — FERROUS GLUCONATE 324 (38 FE) MG PO TABS: 324.0000 mg | ORAL_TABLET | Freq: Every day | ORAL | Status: DC

## 2016-02-27 MED ORDER — LORATADINE 10 MG PO TABS: 10.0000 mg | ORAL_TABLET | Freq: Every day | ORAL | Status: DC | PRN

## 2016-02-27 MED ORDER — DENOSUMAB 120 MG/1.7ML ~~LOC~~ SOLN
120.0000 mg | Freq: Once | SUBCUTANEOUS | Status: AC
Start: 2016-02-27 — End: 2016-02-27
  Administered 2016-02-27: 120 mg via SUBCUTANEOUS
  Filled 2016-02-27: qty 1.7

## 2016-02-27 MED ORDER — FOSAPREPITANT DIMEGLUMINE INJECTION 150 MG
Freq: Once | INTRAVENOUS | Status: AC
  Administered 2016-02-27: 12:00:00 via INTRAVENOUS
  Filled 2016-02-27: qty 5

## 2016-02-27 MED ORDER — SODIUM CHLORIDE 0.9 % IV SOLN
Freq: Once | INTRAVENOUS | Status: AC
  Administered 2016-02-27: 12:00:00 via INTRAVENOUS
  Filled 2016-02-27: qty 1000

## 2016-02-27 MED ORDER — BOOST PO LIQD: 237.0000 mL | Freq: Two times a day (BID) | ORAL | Status: AC

## 2016-02-27 MED ORDER — DOCETAXEL CHEMO INJECTION 160 MG/16ML
70.0000 mg/m2 | Freq: Once | INTRAVENOUS | Status: AC
  Administered 2016-02-27: 130 mg via INTRAVENOUS
  Filled 2016-02-27: qty 13

## 2016-02-27 MED ORDER — PEGFILGRASTIM 6 MG/0.6ML ~~LOC~~ PSKT
6.0000 mg | PREFILLED_SYRINGE | Freq: Once | SUBCUTANEOUS | Status: AC
  Administered 2016-02-27: 6 mg via SUBCUTANEOUS
  Filled 2016-02-27: qty 0.6

## 2016-02-29 ENCOUNTER — Encounter: Payer: Self-pay | Admitting: Oncology

## 2016-02-29 NOTE — Progress Notes (Signed)
[name] [phone]) [phone]  Fax:(336) [place]: 1941/08/04  MR#: [id number]  JOI#:[id number]  Patient Care Team: Pcp Not In System as PCP - General  CHIEF COMPLAINT:  Chief Complaint  Patient presents with  . Prostate Cancer   Oncology History   1.MRI scan on August 4 of 2015  revealed in your able liver lesion and multiple areas  of   abnormallity  in the bone suggestive off metastases  2.High PSA. 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  Patient has been started on [facility] (August, 2015) and Delton See, 4.patient started on Alliance protocol with Gillermina Phy nd ZYTIGA (May, 2016)      5.  Progressing disease on Alliance protocol by tumor markers and CT scan criteria 7.  Patient has been started on Taxotere and prednisone from January of 2017  Oncology Flowsheet 12/05/2015 12/26/2015 01/02/2016 01/16/2016 01/30/2016 02/06/2016 02/27/2016  Day, Cycle Day 1, Cycle 2 Day 1, Cycle 3 - Day 1, Cycle 4 - Day 1, Cycle 5 Day 1, Cycle 6  degarelix (FIRMAGON) [place] - - 80 mg - 80 mg - 80 mg  denosumab (XGEVA) [place] - - 120 mg - 120 mg - 120 mg  dexamethasone (DECADRON) IV [ 12 mg ] [ 12 mg ] - [ 12 mg ] - [ 12 mg ] [ 12 mg ]  DOCEtaxel (TAXOTERE) IV 70 mg/m2 70 mg/m2 - 70 mg/m2 - 70 mg/m2 70 mg/m2  fosaprepitant (EMEND) IV [ 150 mg ] [ 150 mg ] - [ 150 mg ] - [ 150 mg ] [ 150 mg ]  palonosetron (ALOXI) IV 0.25 mg 0.25 mg - 0.25 mg - 0.25 mg 0.25 mg  pegfilgrastim (NEULASTA ONPRO KIT) [place] 6 mg 6 mg - 6 mg - 6 mg 6 mg   5.  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    Patient is here for the stage IV carcinoma prostate on Taxotere.  Castration resistant prostate  cancer. PSA shows declining trend.   Patient is here For further follow-up regarding stage IV carcinoma of prostate.  Tolerating Taxotere very well.  No tingling.  No numbness.  Pain is under better control appetite has improved.  No nausea.  No vomiting no diarrhea or skin rash t. REVIEW OF SYSTEMS:   Review of Systems  Psychiatric/Behavioral: The patient does not have insomnia.    GENERAL:  Feels good.  Active.  Appetite is poor and losing weight PERFORMANCE STATUS (ECOG):  01 HEENT:  No visual changes, runny nose, sore throat, mouth sores or tenderness. Lungs: No shortness of breath or cough.  No hemoptysis. Cardiac:  No chest pain, palpitations, orthopnea, or PND. GI: patient has increasing pain in the right upper quadrant. GU:  No urgency, frequency, dysuria, or hematuria. Musculoskeletal:  No back pain.  No joint pain.  No muscle tenderness. Extremities:  No pain or swelling. Skin:  No rashes or skin changes. Neuro:  No headache, numbness or weakness, balance or coordination issues. Endocrine:  No diabetes, thyroid issues, hot flashes or night sweats. Psych:  No mood changes, depression or anxiety. Pain:  No focal pain. Review of systems:  All other systems reviewed  and found to be negative. As per HPI. Otherwise, a complete review of systems is negatve.  PAST MEDICAL HISTORY: Past Medical History  Diagnosis Date  . Hyperlipidemia   . Prostate cancer ([place])     2014  . Bone cancer ([place])     PAST SURGICAL HISTORY: Past Surgical History  Procedure Laterality Date  . Ankle surgery Right   . Peripheral vascular catheterization N/A 11/26/2015    Procedure: Glori Luis Cath Insertion;  Surgeon: Katha Cabal, MD;  Location: [place] [facility];  Service: Cardiovascular;  Laterality: N/A;    FAMILY HISTORY There is no significant family history of breast cancer, ovarian cancer, colon cancer    HEALTH MAINTENANCE: Social History  Substance Use Topics  . Smoking status:  Former Smoker -- 0.50 packs/day for 45 years    Types: Cigarettes    Quit date: 11/13/1991  . Smokeless tobacco: None  . Alcohol Use: 29.4 oz/week    6 Cans of beer, 43 Shots of liquor per week      Allergies  Allergen Reactions  . No Known Allergies      OBJECTIVE: Filed Vitals:   02/27/16 1126  BP: 161/68  Pulse: 43  Temp: 97.9 F (36.6 C)  Resp: 18     Body mass index is 21.83 kg/(m^2).    ECOG FS:1 - Symptomatic but completely ambulatory  Physical Exam  GENERAL:  Well developed, well nourished, sitting comfortably in the exam room in no acute distress. MENTAL STATUS:  Alert and oriented to person, place and time. Alopecia  .  No evidence of stomatitis. ENT:  Oropharynx clear without lesion.  Tongue normal. Mucous membranes moist.  RESPIRATORY:  Clear to auscultation without rales, wheezes or rhonchi. CARDIOVASCULAR:  Regular rate and rhythm without murmur, rub or gallop. BREAST:  Right breast without masses, skin changes or nipple discharge.  Left breast without masses, skin changes or nipple discharge. ABDOMEN:  Soft, non-tender, with active bowel sounds, and no hepatosplenomegaly.  No masses. BACK:  No CVA tenderness.  No tenderness on percussion of the back or rib cage. SKIN:  No rashes, ulcers or lesions. EXTREMITIES: No edema, no skin discoloration or tenderness.  No palpable cords. LYMPH NODES: No palpable cervical, supraclavicular, axillary or inguinal adenopathy  NEUROLOGICAL: Unremarkable. PSYCH:  Appropriate... LAB RESULTS:          No results found for: SPEP, UPEP  Lab Results  Component Value Date   WBC 10.0 02/27/2016   NEUTROABS 7.0* 02/27/2016   HGB 8.5* 02/27/2016   HCT 25.9* 02/27/2016   MCV 88.3 02/27/2016   PLT 307 02/27/2016      Chemistry     STUDIES: No results found.  ASSESSMENT:  Stage IV carcinoma prostate.  Castration resistant prostate cancer . Marland KitchenContinue to lose weight   RECENT PSA is 152.  Slightly elevated in  comparison to previous PSA which was 148 . overall stable disease  XGEVA as well as Taxotere be continued.  Depending on the next examination to PSA his CT scan of abdomen can be ordered.  Overall stable disease.  Symptomatically patient is improving     Staging form: Prostate, AJCC 7th Edition     Clinical: Stage IV (T3, N0, M1b, PSA: 20 or greater, Gleason 8-10 - Poorly differentiated/undifferentiated (marked anaplasia)) - Signed by Forest Gleason, MD on 03/17/2015   Forest Gleason, MD   02/29/2016 2:26 AM          Smoking History: Smoking History 1(1)Packs per day and  Quit 1993 after 30 yr history.(1)                         )     Advance Directive:  Advance Directive ([place]) yes(1)   Do you want to revise or change your advance directive? No(1)                                 )           [place] @ [facility]                           )           [place] @ [facility] Telephone:([phone]) [phone]  Fax:(336) [place]: Mar 30, 1941  MR#: [id number]  EPP#:[id number]  Patient Care Team: Pcp Not In System as PCP - General  CHIEF COMPLAINT:  Chief Complaint  Patient presents with  . Prostate Cancer   Oncology History   1.MRI scan on August 4 of 2015  revealed in your able liver lesion and multiple areas  of   abnormallity  in the bone suggestive off metastases  2.High PSA. 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  Patient has been started on [facility] (August, 2015) and Delton See, 4.patient started on Alliance protocol with Gillermina Phy nd ZYTIGA (May, 2016)      5.  Progressing disease on Alliance protocol by tumor markers and CT scan criteria 7.  Patient has been started on Taxotere and prednisone from January of 2017  Oncology Flowsheet 12/05/2015 12/26/2015 01/02/2016 01/16/2016 01/30/2016  02/06/2016 02/27/2016  Day, Cycle Day 1, Cycle 2 Day 1, Cycle 3 - Day 1, Cycle 4 - Day 1, Cycle 5 Day 1, Cycle 6  degarelix (FIRMAGON) [place] - - 80 mg - 80 mg - 80 mg  denosumab (XGEVA) [place] - - 120 mg - 120 mg - 120 mg  dexamethasone (DECADRON) IV [ 12 mg ] [ 12 mg ] - [ 12 mg ] - [ 12 mg ] [ 12 mg ]  DOCEtaxel (TAXOTERE) IV 70 mg/m2 70 mg/m2 - 70 mg/m2 - 70 mg/m2 70 mg/m2  fosaprepitant (EMEND) IV [ 150 mg ] [ 150 mg ] - [ 150 mg ] - [ 150 mg ] [ 150 mg ]  palonosetron (ALOXI) IV 0.25 mg 0.25 mg - 0.25 mg - 0.25 mg 0.25 mg  pegfilgrastim (NEULASTA ONPRO KIT) [place] 6 mg 6 mg - 6 mg - 6 mg 6 mg   5.  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    Patient is here for the stage IV carcinoma prostate on Taxotere.  Castration resistant prostate cancer. PSA shows declining trend.  Patient is constipated and has lost approximately 10 pounds of weight in last 3 weeks. According to him appetite is fairly good but because of constipation he has a stomach upset.  His caregiver started bad of MiraLAX and so stopped number which is now working.  No nausea no vomiting no diarrhea no pain     Patient is here for  ongoing evaluation and treatment consideration No chills.  No fever.  No nausea.  No vomiting.  No diarrhea.  Appetite is improved.  PSA shows a downward trend.  No tingling numbness Patient is here for ongoing evaluation and consideration of treatment. REVIEW OF SYSTEMS:   ROS GENERAL:  Feels good.  Active.  Appetite is poor and losing weight PERFORMANCE STATUS (ECOG):  01 HEENT:  No visual changes, runny nose, sore throat, mouth sores or tenderness. Lungs: No shortness of breath or cough.  No hemoptysis. Cardiac:  No chest pain, palpitations, orthopnea, or PND. GI: patient has increasing pain in  the right upper quadrant. GU:  No urgency, frequency, dysuria, or hematuria. Musculoskeletal:  No back pain.  No joint pain.  No muscle tenderness. Extremities:  No pain or swelling. Skin:  No rashes or skin changes. Neuro:  No headache, numbness or weakness, balance or coordination issues. Endocrine:  No diabetes, thyroid issues, hot flashes or night sweats. Psych:  No mood changes, depression or anxiety. Pain:  No focal pain. Review of systems:  All other systems reviewed and found to be negative. As per HPI. Otherwise, a complete review of systems is negatve.  PAST MEDICAL HISTORY: Past Medical History  Diagnosis Date  . Hyperlipidemia   . Prostate cancer ([place])     2014  . Bone cancer ([place])     PAST SURGICAL HISTORY: Past Surgical History  Procedure Laterality Date  . Ankle surgery Right   . Peripheral vascular catheterization N/A 11/26/2015    Procedure: Glori Luis Cath Insertion;  Surgeon: Katha Cabal, MD;  Location: [place] [facility];  Service: Cardiovascular;  Laterality: N/A;    FAMILY HISTORY There is no significant family history of breast cancer, ovarian cancer, colon cancer    HEALTH MAINTENANCE: Social History  Substance Use Topics  . Smoking status: Former Smoker -- 0.50 packs/day for 45 years    Types: Cigarettes    Quit date: 11/13/1991  . Smokeless tobacco: None  . Alcohol Use: 29.4 oz/week    6 Cans of beer, 43 Shots of liquor per week      Allergies  Allergen Reactions  . No Known Allergies      OBJECTIVE: Filed Vitals:   02/27/16 1126  BP: 161/68  Pulse: 43  Temp: 97.9 F (36.6 C)  Resp: 18     Body mass index is 21.83 kg/(m^2).    ECOG FS:1 - Symptomatic but completely ambulatory  Physical Exam  GENERAL:  Well developed, well nourished, sitting comfortably in the exam room in no acute distress. MENTAL STATUS:  Alert and oriented to person, place and time. Alopecia  .  No evidence of stomatitis. ENT:  Oropharynx clear without  lesion.  Tongue normal. Mucous membranes moist.  RESPIRATORY:  Clear to auscultation without rales, wheezes or rhonchi. CARDIOVASCULAR:  Regular rate and rhythm without murmur, rub or gallop. BREAST:  Right breast without masses, skin changes or nipple discharge.  Left breast without masses, skin changes or nipple discharge. ABDOMEN:  Soft, non-tender, with active bowel sounds, and no hepatosplenomegaly.  No masses. BACK:  No CVA tenderness.  No tenderness on percussion of the back or rib cage. SKIN:  No rashes, ulcers or lesions. EXTREMITIES: No edema, no skin discoloration or tenderness.  No palpable cords. LYMPH NODES: No palpable cervical, supraclavicular, axillary or inguinal adenopathy  NEUROLOGICAL: Unremarkable. PSYCH:  Appropriate... LAB RESULTS:          No results found for: SPEP, UPEP  Lab  Results  Component Value Date   WBC 10.0 02/27/2016   NEUTROABS 7.0* 02/27/2016   HGB 8.5* 02/27/2016   HCT 25.9* 02/27/2016   MCV 88.3 02/27/2016   PLT 307 02/27/2016      Chemistry     STUDIES: No results found.  ASSESSMENT:  Stage IV carcinoma prostate.  Castration resistant prostate cancer . Marland Kitchen  On state patient with significant weight loss Constipation is due to opium induce patient was instructed all regarding constipation.  MiraLAX to stools soft no and if needed Senokot-S can maintain to reduced. Continue prednisone PSA is pending.  Last PSA was 218 which shows downward trend All of the lab data has been reviewed liver enzymes are improving Continue chemotherapy along with Neulasta [place] Haring is well controlled continue fentanyl patch 50 g and oxycodone for breakthrough pain medication Anemia is multifactorial and will be watched carefully       Staging form: Prostate, AJCC 7th Edition     Clinical: Stage IV (T3, N0, M1b, PSA: 20 or greater, Gleason 8-10 - Poorly differentiated/undifferentiated (marked anaplasia)) - Signed by Forest Gleason, MD on  03/17/2015   Forest Gleason, MD   02/29/2016 2:26 AM          Smoking History: Smoking History 1(1)Packs per day and Quit 1993 after 30 yr history.(1)                         )     Advance Directive:  Advance Directive ([place]) yes(1)   Do you want to revise or change your advance directive? No(1)                                 )           [place] @ [facility]                           )

## 2016-03-05 ENCOUNTER — Inpatient Hospital Stay: Payer: Medicare Other

## 2016-03-05 DIAGNOSIS — Z5111 Encounter for antineoplastic chemotherapy: Secondary | ICD-10-CM | POA: Diagnosis not present

## 2016-03-05 DIAGNOSIS — C61 Malignant neoplasm of prostate: Secondary | ICD-10-CM

## 2016-03-05 LAB — CBC WITH DIFFERENTIAL/PLATELET
BASOS PCT: 0 %
Basophils Absolute: 0.1 10*3/uL (ref 0–0.1)
EOS ABS: 0.1 10*3/uL (ref 0–0.7)
Eosinophils Relative: 1 %
HCT: 26.5 % — ABNORMAL LOW (ref 40.0–52.0)
Hemoglobin: 8.4 g/dL — ABNORMAL LOW (ref 13.0–18.0)
LYMPHS ABS: 3.5 10*3/uL (ref 1.0–3.6)
Lymphocytes Relative: 13 %
MCH: 28.2 pg (ref 26.0–34.0)
MCHC: 31.8 g/dL — AB (ref 32.0–36.0)
MCV: 88.5 fL (ref 80.0–100.0)
Monocytes Absolute: 2 10*3/uL — ABNORMAL HIGH (ref 0.2–1.0)
Monocytes Relative: 7 %
NEUTROS ABS: 21.8 10*3/uL — AB (ref 1.4–6.5)
NEUTROS PCT: 79 %
PLATELETS: 237 10*3/uL (ref 150–440)
RBC: 2.99 MIL/uL — AB (ref 4.40–5.90)
RDW: 21.4 % — AB (ref 11.5–14.5)
WBC: 27.4 10*3/uL — AB (ref 3.8–10.6)

## 2016-03-12 ENCOUNTER — Inpatient Hospital Stay: Payer: Medicare Other | Attending: Oncology

## 2016-03-12 ENCOUNTER — Other Ambulatory Visit: Payer: Self-pay | Admitting: *Deleted

## 2016-03-12 DIAGNOSIS — Z87891 Personal history of nicotine dependence: Secondary | ICD-10-CM | POA: Insufficient documentation

## 2016-03-12 DIAGNOSIS — E785 Hyperlipidemia, unspecified: Secondary | ICD-10-CM | POA: Insufficient documentation

## 2016-03-12 DIAGNOSIS — C787 Secondary malignant neoplasm of liver and intrahepatic bile duct: Secondary | ICD-10-CM | POA: Diagnosis not present

## 2016-03-12 DIAGNOSIS — Z79899 Other long term (current) drug therapy: Secondary | ICD-10-CM | POA: Diagnosis not present

## 2016-03-12 DIAGNOSIS — C61 Malignant neoplasm of prostate: Secondary | ICD-10-CM | POA: Diagnosis not present

## 2016-03-12 DIAGNOSIS — Z7689 Persons encountering health services in other specified circumstances: Secondary | ICD-10-CM | POA: Insufficient documentation

## 2016-03-12 DIAGNOSIS — R634 Abnormal weight loss: Secondary | ICD-10-CM | POA: Diagnosis not present

## 2016-03-12 DIAGNOSIS — D649 Anemia, unspecified: Secondary | ICD-10-CM | POA: Diagnosis not present

## 2016-03-12 DIAGNOSIS — Z5111 Encounter for antineoplastic chemotherapy: Secondary | ICD-10-CM | POA: Diagnosis present

## 2016-03-12 DIAGNOSIS — C7951 Secondary malignant neoplasm of bone: Secondary | ICD-10-CM | POA: Diagnosis not present

## 2016-03-12 LAB — CBC WITH DIFFERENTIAL/PLATELET
BASOS ABS: 0.1 10*3/uL (ref 0–0.1)
Basophils Relative: 0 %
EOS PCT: 0 %
Eosinophils Absolute: 0 10*3/uL (ref 0–0.7)
HEMATOCRIT: 26.5 % — AB (ref 40.0–52.0)
HEMOGLOBIN: 8.5 g/dL — AB (ref 13.0–18.0)
LYMPHS ABS: 1.9 10*3/uL (ref 1.0–3.6)
LYMPHS PCT: 12 %
MCH: 28.5 pg (ref 26.0–34.0)
MCHC: 32 g/dL (ref 32.0–36.0)
MCV: 89.1 fL (ref 80.0–100.0)
Monocytes Absolute: 0.8 10*3/uL (ref 0.2–1.0)
Monocytes Relative: 5 %
NEUTROS ABS: 12.7 10*3/uL — AB (ref 1.4–6.5)
Neutrophils Relative %: 83 %
PLATELETS: 201 10*3/uL (ref 150–440)
RBC: 2.98 MIL/uL — AB (ref 4.40–5.90)
RDW: 21.3 % — ABNORMAL HIGH (ref 11.5–14.5)
WBC: 15.4 10*3/uL — AB (ref 3.8–10.6)

## 2016-03-12 MED ORDER — FENTANYL 50 MCG/HR TD PT72
50.0000 ug | MEDICATED_PATCH | TRANSDERMAL | Status: DC
Start: 2016-03-12 — End: 2016-04-23

## 2016-03-12 MED ORDER — OXYCODONE HCL 10 MG PO TABS
10.0000 mg | ORAL_TABLET | Freq: Four times a day (QID) | ORAL | Status: DC | PRN
Start: 2016-03-12 — End: 2016-03-17

## 2016-03-17 ENCOUNTER — Telehealth: Payer: Self-pay | Admitting: *Deleted

## 2016-03-17 DIAGNOSIS — C61 Malignant neoplasm of prostate: Secondary | ICD-10-CM

## 2016-03-17 MED ORDER — OXYCODONE HCL 10 MG PO TABS: 10.0000 mg | ORAL_TABLET | Freq: Four times a day (QID) | ORAL | Status: DC | PRN

## 2016-03-17 NOTE — Telephone Encounter (Signed)
Johnny called to state that pt needs refill of pain medication and would like to pick up on Thursday. Prescription is ready for pick up and was placed downstairs at the registration desk.

## 2016-03-19 ENCOUNTER — Inpatient Hospital Stay: Payer: Medicare Other | Admitting: *Deleted

## 2016-03-19 DIAGNOSIS — Z5111 Encounter for antineoplastic chemotherapy: Secondary | ICD-10-CM | POA: Diagnosis not present

## 2016-03-19 DIAGNOSIS — C61 Malignant neoplasm of prostate: Secondary | ICD-10-CM

## 2016-03-19 LAB — CBC WITH DIFFERENTIAL/PLATELET
BASOS ABS: 0 10*3/uL (ref 0–0.1)
BASOS PCT: 1 %
Eosinophils Absolute: 0 10*3/uL (ref 0–0.7)
Eosinophils Relative: 0 %
HEMATOCRIT: 27.4 % — AB (ref 40.0–52.0)
Hemoglobin: 8.9 g/dL — ABNORMAL LOW (ref 13.0–18.0)
LYMPHS ABS: 1.1 10*3/uL (ref 1.0–3.6)
LYMPHS PCT: 12 %
MCH: 28.8 pg (ref 26.0–34.0)
MCHC: 32.6 g/dL (ref 32.0–36.0)
MCV: 88.6 fL (ref 80.0–100.0)
Monocytes Absolute: 1 10*3/uL (ref 0.2–1.0)
Monocytes Relative: 10 %
NEUTROS ABS: 7.6 10*3/uL — AB (ref 1.4–6.5)
Neutrophils Relative %: 77 %
PLATELETS: 309 10*3/uL (ref 150–440)
RBC: 3.1 MIL/uL — AB (ref 4.40–5.90)
RDW: 20.8 % — ABNORMAL HIGH (ref 11.5–14.5)
WBC: 9.7 10*3/uL (ref 3.8–10.6)

## 2016-03-26 ENCOUNTER — Inpatient Hospital Stay: Payer: Medicare Other

## 2016-03-26 ENCOUNTER — Inpatient Hospital Stay (HOSPITAL_BASED_OUTPATIENT_CLINIC_OR_DEPARTMENT_OTHER): Payer: Medicare Other | Admitting: Oncology

## 2016-03-26 ENCOUNTER — Encounter: Payer: Self-pay | Admitting: Oncology

## 2016-03-26 VITALS — BP 134/72 | HR 67 | Temp 98.2°F | Resp 18 | Wt 148.1 lb

## 2016-03-26 DIAGNOSIS — C787 Secondary malignant neoplasm of liver and intrahepatic bile duct: Secondary | ICD-10-CM

## 2016-03-26 DIAGNOSIS — C7951 Secondary malignant neoplasm of bone: Secondary | ICD-10-CM

## 2016-03-26 DIAGNOSIS — R634 Abnormal weight loss: Secondary | ICD-10-CM

## 2016-03-26 DIAGNOSIS — Z5111 Encounter for antineoplastic chemotherapy: Secondary | ICD-10-CM | POA: Diagnosis not present

## 2016-03-26 DIAGNOSIS — C61 Malignant neoplasm of prostate: Secondary | ICD-10-CM

## 2016-03-26 DIAGNOSIS — D649 Anemia, unspecified: Secondary | ICD-10-CM

## 2016-03-26 DIAGNOSIS — Z79899 Other long term (current) drug therapy: Secondary | ICD-10-CM

## 2016-03-26 LAB — COMPREHENSIVE METABOLIC PANEL
ALBUMIN: 3.4 g/dL — AB (ref 3.5–5.0)
ALK PHOS: 102 U/L (ref 38–126)
ALT: 19 U/L (ref 17–63)
AST: 27 U/L (ref 15–41)
Anion gap: 4 — ABNORMAL LOW (ref 5–15)
BILIRUBIN TOTAL: 0.5 mg/dL (ref 0.3–1.2)
BUN: 22 mg/dL — AB (ref 6–20)
CALCIUM: 8.4 mg/dL — AB (ref 8.9–10.3)
CO2: 25 mmol/L (ref 22–32)
CREATININE: 1.03 mg/dL (ref 0.61–1.24)
Chloride: 104 mmol/L (ref 101–111)
GFR calc Af Amer: >60 mL/min (ref >60)
GLUCOSE: 119 mg/dL — AB (ref 65–99)
POTASSIUM: 4.3 mmol/L (ref 3.5–5.1)
Sodium: 133 mmol/L — ABNORMAL LOW (ref 135–145)
TOTAL PROTEIN: 7.3 g/dL (ref 6.5–8.1)

## 2016-03-26 LAB — CBC WITH DIFFERENTIAL/PLATELET
Basophils Absolute: 0 10*3/uL (ref 0–0.1)
Basophils Relative: 0 %
EOS ABS: 0 10*3/uL (ref 0–0.7)
EOS PCT: 0 %
HCT: 26.3 % — ABNORMAL LOW (ref 40.0–52.0)
Hemoglobin: 8.5 g/dL — ABNORMAL LOW (ref 13.0–18.0)
LYMPHS ABS: 0.8 10*3/uL — AB (ref 1.0–3.6)
LYMPHS PCT: 9 %
MCH: 28.1 pg (ref 26.0–34.0)
MCHC: 32.4 g/dL (ref 32.0–36.0)
MCV: 86.7 fL (ref 80.0–100.0)
MONOS PCT: 9 %
Monocytes Absolute: 0.8 10*3/uL (ref 0.2–1.0)
Neutro Abs: 7.2 10*3/uL — ABNORMAL HIGH (ref 1.4–6.5)
Neutrophils Relative %: 82 %
PLATELETS: 363 10*3/uL (ref 150–440)
RBC: 3.04 MIL/uL — ABNORMAL LOW (ref 4.40–5.90)
RDW: 19.8 % — ABNORMAL HIGH (ref 11.5–14.5)
WBC: 8.8 10*3/uL (ref 3.8–10.6)

## 2016-03-26 LAB — PSA: PSA: 240 ng/mL — ABNORMAL HIGH (ref 0.00–4.00)

## 2016-03-26 MED ORDER — DENOSUMAB 120 MG/1.7ML ~~LOC~~ SOLN
120.0000 mg | Freq: Once | SUBCUTANEOUS | Status: AC
  Administered 2016-03-26: 120 mg via SUBCUTANEOUS
  Filled 2016-03-26: qty 1.7

## 2016-03-26 MED ORDER — SODIUM CHLORIDE 0.9 % IV SOLN
Freq: Once | INTRAVENOUS | Status: AC
  Administered 2016-03-26: 11:00:00 via INTRAVENOUS
  Filled 2016-03-26: qty 5

## 2016-03-26 MED ORDER — PEGFILGRASTIM 6 MG/0.6ML ~~LOC~~ PSKT
6.0000 mg | PREFILLED_SYRINGE | Freq: Once | SUBCUTANEOUS | Status: AC
  Administered 2016-03-26: 6 mg via SUBCUTANEOUS
  Filled 2016-03-26: qty 0.6

## 2016-03-26 MED ORDER — DEGARELIX ACETATE 80 MG ~~LOC~~ SOLR
80.0000 mg | Freq: Once | SUBCUTANEOUS | Status: AC
  Administered 2016-03-26: 80 mg via SUBCUTANEOUS
  Filled 2016-03-26: qty 4

## 2016-03-26 MED ORDER — SODIUM CHLORIDE 0.9 % IV SOLN
Freq: Once | INTRAVENOUS | Status: AC
  Administered 2016-03-26: 11:00:00 via INTRAVENOUS
  Filled 2016-03-26: qty 1000

## 2016-03-26 MED ORDER — DOCETAXEL CHEMO INJECTION 160 MG/16ML
70.0000 mg/m2 | Freq: Once | INTRAVENOUS | Status: AC
  Administered 2016-03-26: 130 mg via INTRAVENOUS
  Filled 2016-03-26: qty 13

## 2016-03-26 MED ORDER — PALONOSETRON HCL INJECTION 0.25 MG/5ML
0.2500 mg | Freq: Once | INTRAVENOUS | Status: AC
  Administered 2016-03-26: 0.25 mg via INTRAVENOUS
  Filled 2016-03-26: qty 5

## 2016-03-26 MED ORDER — HEPARIN SOD (PORK) LOCK FLUSH 100 UNIT/ML IV SOLN
500.0000 [IU] | Freq: Once | INTRAVENOUS | Status: AC
  Administered 2016-03-26: 500 [IU] via INTRAVENOUS
  Filled 2016-03-26: qty 5

## 2016-03-26 MED ORDER — SODIUM CHLORIDE 0.9% FLUSH
10.0000 mL | Freq: Once | INTRAVENOUS | Status: AC
  Administered 2016-03-26: 10 mL via INTRAVENOUS
  Filled 2016-03-26: qty 10

## 2016-03-26 NOTE — Progress Notes (Signed)
[place] @ [facility] [phone]  Fax:([phone]) [place]: July 12, 1941  MR#: [id number]  VOP#:[id number]  Patient Care Team: Pcp Not In System as PCP - General  CHIEF COMPLAINT:  Chief Complaint  Patient presents with  . Prostate Cancer   Oncology History   1.MRI scan on August 4 of 2015  revealed in your able liver lesion and multiple areas  of   abnormallity  in the bone suggestive off metastases  2.High PSA. 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  Patient has been started on [facility] (August, 2015) and Delton See, 4.patient started on [facility] protocol with Gillermina Phy nd ZYTIGA (May, 2016)      5.  Progressing disease on [facility] protocol by tumor markers and CT scan criteria 7.  Patient has been started on Taxotere and prednisone from January of 2017 8.  Oncology Flowsheet 12/26/2015 01/02/2016 01/16/2016 01/30/2016 02/06/2016 02/27/2016 03/26/2016  Day, Cycle Day 1, Cycle 3 - Day 1, Cycle 4 - Day 1, Cycle 5 Day 1, Cycle 6 Day 1, Cycle 7  degarelix (FIRMAGON) [place] - 80 mg - 80 mg - 80 mg 80 mg  denosumab (XGEVA) [place] - 120 mg - 120 mg - 120 mg 120 mg  dexamethasone (DECADRON) IV [ 12 mg ] - [ 12 mg ] - [ 12 mg ] [ 12 mg ] [ 12 mg ]  DOCEtaxel (TAXOTERE) IV 70 mg/m2 - 70 mg/m2 - 70 mg/m2 70 mg/m2 70 mg/m2  fosaprepitant (EMEND) IV [ 150 mg ] - [ 150 mg ] - [ 150 mg ] [ 150 mg ] [ 150 mg ]  palonosetron (ALOXI) IV 0.25 mg - 0.25 mg - 0.25 mg 0.25 mg 0.25 mg  pegfilgrastim (NEULASTA ONPRO KIT) [place] 6 mg - 6 mg - 6 mg 6 mg 6 mg   5.  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    Patient is here for the stage IV carcinoma prostate on Taxotere.  Castration  resistant prostate cancer. PSA shows declining trend.   Patient is here For further follow-up regarding stage IV carcinoma of prostate.  Tolerating Taxotere very well.  No tingling.  No numbness.  Pain is under better control appetite has improved.  No nausea.  No vomiting no diarrhea or skin rash t  Patient is here for ongoing evaluation and continuation of treatment.  No abdominal pain.  No nausea.  No vomiting.  No diarrhea.  No tingling.  No numbness.Marland Kitchen REVIEW OF SYSTEMS:   Review of Systems  Psychiatric/Behavioral: The patient does not have insomnia.    GENERAL:  Feels good.  Active.  Appetite is poor and losing weight PERFORMANCE STATUS (ECOG):  01 HEENT:  No visual changes, runny nose, sore throat, mouth sores or tenderness. Lungs: No shortness of breath or cough.  No hemoptysis. Cardiac:  No chest pain, palpitations, orthopnea, or PND. GI: patient has increasing pain in the right upper quadrant. GU:  No urgency, frequency, dysuria, or hematuria. Musculoskeletal:  No back pain.  No joint pain.  No muscle tenderness. Extremities:  No pain or swelling. Skin:  No rashes or skin changes. Neuro:  No headache, numbness or weakness, balance or coordination  issues. Endocrine:  No diabetes, thyroid issues, hot flashes or night sweats. Psych:  No mood changes, depression or anxiety. Pain:  No focal pain. Review of systems:  All other systems reviewed and found to be negative. As per HPI. Otherwise, a complete review of systems is negatve.  PAST MEDICAL HISTORY: Past Medical History  Diagnosis Date  . Hyperlipidemia   . Prostate cancer ([place])     2014  . Bone cancer ([place])     PAST SURGICAL HISTORY: Past Surgical History  Procedure Laterality Date  . Ankle surgery Right   . Peripheral vascular catheterization N/A 11/26/2015    Procedure: Glori Luis Cath Insertion;  Surgeon: Katha Cabal, MD;  Location: [place] [facility];  Service: Cardiovascular;  Laterality: N/A;    FAMILY  HISTORY There is no significant family history of breast cancer, ovarian cancer, colon cancer    HEALTH MAINTENANCE: Social History  Substance Use Topics  . Smoking status: Former Smoker -- 0.50 packs/day for 45 years    Types: Cigarettes    Quit date: 11/13/1991  . Smokeless tobacco: None  . Alcohol Use: 29.4 oz/week    6 Cans of beer, 43 Shots of liquor per week      Allergies  Allergen Reactions  . No Known Allergies      OBJECTIVE: Filed Vitals:   03/26/16 1004 03/26/16 1005  BP: 134/72   Pulse: 67   Temp: 98.2 F (36.8 C)   Resp:  18     Body mass index is 21.26 kg/(m^2).    ECOG FS:1 - Symptomatic but completely ambulatory  Physical Exam  GENERAL:  Well developed, well nourished, sitting comfortably in the exam room in no acute distress. MENTAL STATUS:  Alert and oriented to person, place and time. Alopecia  .  No evidence of stomatitis. ENT:  Oropharynx clear without lesion.  Tongue normal. Mucous membranes moist.  RESPIRATORY:  Clear to auscultation without rales, wheezes or rhonchi. CARDIOVASCULAR:  Regular rate and rhythm without murmur, rub or gallop. BREAST:  Right breast without masses, skin changes or nipple discharge.  Left breast without masses, skin changes or nipple discharge. ABDOMEN:  Soft, non-tender, with active bowel sounds, and no hepatosplenomegaly.  No masses. BACK:  No CVA tenderness.  No tenderness on percussion of the back or rib cage. SKIN:  No rashes, ulcers or lesions. EXTREMITIES: No edema, no skin discoloration or tenderness.  No palpable cords. LYMPH NODES: No palpable cervical, supraclavicular, axillary or inguinal adenopathy  NEUROLOGICAL: Unremarkable. PSYCH:  Appropriate... LAB RESULTS:          No results found for: SPEP, UPEP  Lab Results  Component Value Date   WBC 8.8 03/26/2016   NEUTROABS 7.2* 03/26/2016   HGB 8.5* 03/26/2016   HCT 26.3* 03/26/2016   MCV 86.7 03/26/2016   PLT 363 03/26/2016       Chemistry     STUDIES: No results found.  ASSESSMENT:  Stage IV carcinoma prostate.  Castration resistant prostate cancer . Marland KitchenContinue to lose weight  All lab data has been reviewed. Anemia persist Most likely multifactorial including malignancy as well as chemotherapy induced Continue chemotherapy Repeat CT scan of abdomen for reassessment PSA is pending In my absence patient is going to be evaluated by Dr. B    Staging form: Prostate, AJCC 7th Edition     Clinical: Stage IV (T3, N0, M1b, PSA: 20 or greater, Gleason 8-10 - Poorly differentiated/undifferentiated (marked anaplasia)) - Signed by Forest Gleason, MD on 03/17/2015  Forest Gleason, MD   03/26/2016 1:34 PM          Smoking History: Smoking History 1(1)Packs per day and Quit 1993 after 30 yr history.(1)                         )     Advance Directive:  Advance Directive ([place]) yes(1)   Do you want to revise or change your advance directive? No(1)                                 )           [place] [facility]                           )           [place] @ [facility] [phone]  Fax:(336) [place]: [birth year]  MR#: [id number]  BOF#:[id number]  Patient Care Team: Pcp Not In System as PCP - General  CHIEF COMPLAINT:  Chief Complaint  Patient presents with  . Prostate Cancer   Oncology History   1.MRI scan on August 4 of 2015  revealed in your able liver lesion and multiple areas  of   abnormallity  in the bone suggestive off metastases  2.High PSA. 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  Patient has been started on [facility] (August, 2015) and Delton See, 4.patient started on [facility] protocol with Gillermina Phy nd ZYTIGA (May, 2016)      5.  Progressing disease on Alliance protocol by tumor  markers and CT scan criteria 7.  Patient has been started on Taxotere and prednisone from January of 2017  Oncology Flowsheet 12/26/2015 01/02/2016 01/16/2016 01/30/2016 02/06/2016 02/27/2016 03/26/2016  Day, Cycle Day 1, Cycle 3 - Day 1, Cycle 4 - Day 1, Cycle 5 Day 1, Cycle 6 Day 1, Cycle 7  degarelix (FIRMAGON) [place] - 80 mg - 80 mg - 80 mg 80 mg  denosumab (XGEVA) [place] - 120 mg - 120 mg - 120 mg 120 mg  dexamethasone (DECADRON) IV [ 12 mg ] - [ 12 mg ] - [ 12 mg ] [ 12 mg ] [ 12 mg ]  DOCEtaxel (TAXOTERE) IV 70 mg/m2 - 70 mg/m2 - 70 mg/m2 70 mg/m2 70 mg/m2  fosaprepitant (EMEND) IV [ 150 mg ] - [ 150 mg ] - [ 150 mg ] [ 150 mg ] [ 150 mg ]  palonosetron (ALOXI) IV 0.25 mg - 0.25 mg - 0.25 mg 0.25 mg 0.25 mg  pegfilgrastim (NEULASTA ONPRO KIT) [place] 6 mg - 6 mg - 6 mg 6 mg 6 mg   5.  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    Patient is here for the stage IV carcinoma prostate on Taxotere.  Castration resistant prostate cancer. PSA shows declining trend.  Patient is constipated and has lost approximately 10 pounds of weight in last 3 weeks. According to him appetite is fairly good but because of constipation he has a stomach upset.  His caregiver started bad  of MiraLAX and so stopped number which is now working.  No nausea no vomiting no diarrhea no pain     Patient is here for ongoing evaluation and treatment consideration No chills.  No fever.  No nausea.  No vomiting.  No diarrhea.  Appetite is improved.  PSA shows a downward trend.  No tingling numbness Patient is here for ongoing evaluation and consideration of treatment. REVIEW OF SYSTEMS:   ROS GENERAL:  Feels good.  Active.  Appetite is poor and losing weight PERFORMANCE STATUS (ECOG):  01 HEENT:  No visual changes, runny nose, sore  throat, mouth sores or tenderness. Lungs: No shortness of breath or cough.  No hemoptysis. Cardiac:  No chest pain, palpitations, orthopnea, or PND. GI: patient has increasing pain in the right upper quadrant. GU:  No urgency, frequency, dysuria, or hematuria. Musculoskeletal:  No back pain.  No joint pain.  No muscle tenderness. Extremities:  No pain or swelling. Skin:  No rashes or skin changes. Neuro:  No headache, numbness or weakness, balance or coordination issues. Endocrine:  No diabetes, thyroid issues, hot flashes or night sweats. Psych:  No mood changes, depression or anxiety. Pain:  No focal pain. Review of systems:  All other systems reviewed and found to be negative. As per HPI. Otherwise, a complete review of systems is negatve.  PAST MEDICAL HISTORY: Past Medical History  Diagnosis Date  . Hyperlipidemia   . Prostate cancer ([place])     2014  . Bone cancer ([place])     PAST SURGICAL HISTORY: Past Surgical History  Procedure Laterality Date  . Ankle surgery Right   . Peripheral vascular catheterization N/A 11/26/2015    Procedure: Glori Luis Cath Insertion;  Surgeon: Katha Cabal, MD;  Location: [place] [facility];  Service: Cardiovascular;  Laterality: N/A;    FAMILY HISTORY There is no significant family history of breast cancer, ovarian cancer, colon cancer    HEALTH MAINTENANCE: Social History  Substance Use Topics  . Smoking status: Former Smoker -- 0.50 packs/day for 45 years    Types: Cigarettes    Quit date: 11/13/1991  . Smokeless tobacco: None  . Alcohol Use: 29.4 oz/week    6 Cans of beer, 43 Shots of liquor per week      Allergies  Allergen Reactions  . No Known Allergies      OBJECTIVE: Filed Vitals:   03/26/16 1004 03/26/16 1005  BP: 134/72   Pulse: 67   Temp: 98.2 F (36.8 C)   Resp:  18     Body mass index is 21.26 kg/(m^2).    ECOG FS:1 - Symptomatic but completely ambulatory  Physical Exam  GENERAL:  Well developed, well  nourished, sitting comfortably in the exam room in no acute distress. MENTAL STATUS:  Alert and oriented to person, place and time. Alopecia  .  No evidence of stomatitis. ENT:  Oropharynx clear without lesion.  Tongue normal. Mucous membranes moist.  RESPIRATORY:  Clear to auscultation without rales, wheezes or rhonchi. CARDIOVASCULAR:  Regular rate and rhythm without murmur, rub or gallop. BREAST:  Right breast without masses, skin changes or nipple discharge.  Left breast without masses, skin changes or nipple discharge. ABDOMEN:  Soft, non-tender, with active bowel sounds, and no hepatosplenomegaly.  No masses. BACK:  No CVA tenderness.  No tenderness on percussion of the back or rib cage. SKIN:  No rashes, ulcers or lesions. EXTREMITIES: No edema, no skin discoloration or tenderness.  No palpable cords. LYMPH NODES:  No palpable cervical, supraclavicular, axillary or inguinal adenopathy  NEUROLOGICAL: Unremarkable. PSYCH:  Appropriate... LAB RESULTS:          No results found for: SPEP, UPEP  Lab Results  Component Value Date   WBC 8.8 03/26/2016   NEUTROABS 7.2* 03/26/2016   HGB 8.5* 03/26/2016   HCT 26.3* 03/26/2016   MCV 86.7 03/26/2016   PLT 363 03/26/2016      Chemistry     STUDIES: No results found.  ASSESSMENT:  Stage IV carcinoma prostate.  Castration resistant prostate cancer . Marland Kitchen  On state patient with significant weight loss Constipation is due to opium induce patient was instructed all regarding constipation.  MiraLAX to stools soft no and if needed Senokot-S can maintain to reduced. Continue prednisone PSA is pending.  Last PSA was 218 which shows downward trend All of the lab data has been reviewed liver enzymes are improving Continue chemotherapy along with Neulasta [place] is well controlled continue fentanyl patch 50 g and oxycodone for breakthrough pain medication Anemia is multifactorial and will be watched carefully       Staging form:  Prostate, AJCC 7th Edition     Clinical: Stage IV (T3, N0, M1b, PSA: 20 or greater, Gleason 8-10 - Poorly differentiated/undifferentiated (marked anaplasia)) - Signed by Forest Gleason, MD on 03/17/2015   Forest Gleason, MD   03/26/2016 1:34 PM          Smoking History: Smoking History 1(1)Packs per day and Quit 1993 after 30 yr history.(1)                         )     Advance Directive:  Advance Directive ([place]) yes(1)   Do you want to revise or change your advance directive? No(1)                                 )           [place] @ [facility]                           )

## 2016-03-26 NOTE — Progress Notes (Signed)
Based on the following inputs:   Current calcium level (mg/dL) 8.4  Current albumin level (g/dL) 3.4   The patient's corrected calcium level is:   8.88 mg/dL

## 2016-03-26 NOTE — Progress Notes (Signed)
Per conversation with Dr. Oliva Bustard today use 03/19/2016 treatment plan for today, change the date, for Taxotere, patient receiving Norberta Keens, and Neulasta today as well. LJ

## 2016-04-02 ENCOUNTER — Inpatient Hospital Stay: Payer: Medicare Other

## 2016-04-09 ENCOUNTER — Inpatient Hospital Stay: Payer: Medicare Other | Attending: Internal Medicine

## 2016-04-09 DIAGNOSIS — E785 Hyperlipidemia, unspecified: Secondary | ICD-10-CM | POA: Diagnosis not present

## 2016-04-09 DIAGNOSIS — C7951 Secondary malignant neoplasm of bone: Secondary | ICD-10-CM | POA: Diagnosis not present

## 2016-04-09 DIAGNOSIS — C61 Malignant neoplasm of prostate: Secondary | ICD-10-CM | POA: Insufficient documentation

## 2016-04-09 DIAGNOSIS — G893 Neoplasm related pain (acute) (chronic): Secondary | ICD-10-CM | POA: Diagnosis not present

## 2016-04-09 DIAGNOSIS — Z79899 Other long term (current) drug therapy: Secondary | ICD-10-CM | POA: Diagnosis not present

## 2016-04-09 DIAGNOSIS — Z87891 Personal history of nicotine dependence: Secondary | ICD-10-CM | POA: Diagnosis not present

## 2016-04-09 DIAGNOSIS — C787 Secondary malignant neoplasm of liver and intrahepatic bile duct: Secondary | ICD-10-CM | POA: Insufficient documentation

## 2016-04-09 DIAGNOSIS — R634 Abnormal weight loss: Secondary | ICD-10-CM | POA: Insufficient documentation

## 2016-04-09 DIAGNOSIS — Z5111 Encounter for antineoplastic chemotherapy: Secondary | ICD-10-CM | POA: Diagnosis not present

## 2016-04-09 DIAGNOSIS — D649 Anemia, unspecified: Secondary | ICD-10-CM | POA: Diagnosis not present

## 2016-04-09 DIAGNOSIS — R109 Unspecified abdominal pain: Secondary | ICD-10-CM | POA: Diagnosis not present

## 2016-04-09 LAB — CBC WITH DIFFERENTIAL/PLATELET
BASOS PCT: 0 %
Basophils Absolute: 0 10*3/uL (ref 0–0.1)
EOS PCT: 0 %
Eosinophils Absolute: 0 10*3/uL (ref 0–0.7)
HEMATOCRIT: 29.4 % — AB (ref 40.0–52.0)
Hemoglobin: 9.3 g/dL — ABNORMAL LOW (ref 13.0–18.0)
LYMPHS PCT: 20 %
Lymphs Abs: 1.6 10*3/uL (ref 1.0–3.6)
MCH: 27.2 pg (ref 26.0–34.0)
MCHC: 31.6 g/dL — AB (ref 32.0–36.0)
MCV: 85.9 fL (ref 80.0–100.0)
MONO ABS: 1 10*3/uL (ref 0.2–1.0)
MONOS PCT: 12 %
NEUTROS ABS: 5.4 10*3/uL (ref 1.4–6.5)
Neutrophils Relative %: 68 %
PLATELETS: 316 10*3/uL (ref 150–440)
RBC: 3.42 MIL/uL — ABNORMAL LOW (ref 4.40–5.90)
RDW: 19.1 % — AB (ref 11.5–14.5)
WBC: 8 10*3/uL (ref 3.8–10.6)

## 2016-04-16 ENCOUNTER — Other Ambulatory Visit: Payer: Medicare Other

## 2016-04-16 ENCOUNTER — Inpatient Hospital Stay: Payer: Medicare Other

## 2016-04-16 DIAGNOSIS — Z5111 Encounter for antineoplastic chemotherapy: Secondary | ICD-10-CM | POA: Diagnosis not present

## 2016-04-16 DIAGNOSIS — C61 Malignant neoplasm of prostate: Secondary | ICD-10-CM

## 2016-04-16 LAB — CBC WITH DIFFERENTIAL/PLATELET
Basophils Absolute: 0.1 10*3/uL (ref 0–0.1)
Basophils Relative: 1 %
EOS ABS: 0 10*3/uL (ref 0–0.7)
Eosinophils Relative: 0 %
HCT: 26.4 % — ABNORMAL LOW (ref 40.0–52.0)
HEMOGLOBIN: 8.3 g/dL — AB (ref 13.0–18.0)
LYMPHS ABS: 1.1 10*3/uL (ref 1.0–3.6)
Lymphocytes Relative: 10 %
MCH: 26.4 pg (ref 26.0–34.0)
MCHC: 31.6 g/dL — AB (ref 32.0–36.0)
MCV: 83.5 fL (ref 80.0–100.0)
MONO ABS: 1 10*3/uL (ref 0.2–1.0)
MONOS PCT: 9 %
NEUTROS PCT: 80 %
Neutro Abs: 9 10*3/uL — ABNORMAL HIGH (ref 1.4–6.5)
PLATELETS: 391 10*3/uL (ref 150–440)
RBC: 3.16 MIL/uL — ABNORMAL LOW (ref 4.40–5.90)
RDW: 19.7 % — AB (ref 11.5–14.5)
WBC: 11.2 10*3/uL — ABNORMAL HIGH (ref 3.8–10.6)

## 2016-04-21 ENCOUNTER — Ambulatory Visit: Admission: RE | Admit: 2016-04-21 | Payer: Medicare Other | Source: Ambulatory Visit

## 2016-04-21 ENCOUNTER — Other Ambulatory Visit: Payer: Self-pay | Admitting: *Deleted

## 2016-04-21 DIAGNOSIS — C61 Malignant neoplasm of prostate: Secondary | ICD-10-CM

## 2016-04-23 ENCOUNTER — Inpatient Hospital Stay: Payer: Medicare Other

## 2016-04-23 ENCOUNTER — Inpatient Hospital Stay (HOSPITAL_BASED_OUTPATIENT_CLINIC_OR_DEPARTMENT_OTHER): Payer: Medicare Other | Admitting: Internal Medicine

## 2016-04-23 VITALS — BP 123/75 | HR 88 | Temp 97.4°F | Resp 18 | Wt 145.1 lb

## 2016-04-23 DIAGNOSIS — C7951 Secondary malignant neoplasm of bone: Secondary | ICD-10-CM

## 2016-04-23 DIAGNOSIS — R634 Abnormal weight loss: Secondary | ICD-10-CM

## 2016-04-23 DIAGNOSIS — G893 Neoplasm related pain (acute) (chronic): Secondary | ICD-10-CM | POA: Insufficient documentation

## 2016-04-23 DIAGNOSIS — Z79899 Other long term (current) drug therapy: Secondary | ICD-10-CM

## 2016-04-23 DIAGNOSIS — Z5111 Encounter for antineoplastic chemotherapy: Secondary | ICD-10-CM

## 2016-04-23 DIAGNOSIS — C787 Secondary malignant neoplasm of liver and intrahepatic bile duct: Secondary | ICD-10-CM

## 2016-04-23 DIAGNOSIS — C61 Malignant neoplasm of prostate: Secondary | ICD-10-CM

## 2016-04-23 DIAGNOSIS — D649 Anemia, unspecified: Secondary | ICD-10-CM

## 2016-04-23 DIAGNOSIS — R109 Unspecified abdominal pain: Secondary | ICD-10-CM

## 2016-04-23 LAB — COMPREHENSIVE METABOLIC PANEL
ALT: 48 U/L (ref 17–63)
AST: 47 U/L — AB (ref 15–41)
Albumin: 3 g/dL — ABNORMAL LOW (ref 3.5–5.0)
Alkaline Phosphatase: 283 U/L — ABNORMAL HIGH (ref 38–126)
Anion gap: 9 (ref 5–15)
BILIRUBIN TOTAL: 0.7 mg/dL (ref 0.3–1.2)
BUN: 24 mg/dL — AB (ref 6–20)
CHLORIDE: 99 mmol/L — AB (ref 101–111)
CO2: 26 mmol/L (ref 22–32)
CREATININE: 1.14 mg/dL (ref 0.61–1.24)
Calcium: 8.6 mg/dL — ABNORMAL LOW (ref 8.9–10.3)
Glucose, Bld: 140 mg/dL — ABNORMAL HIGH (ref 65–99)
POTASSIUM: 4.5 mmol/L (ref 3.5–5.1)
Sodium: 134 mmol/L — ABNORMAL LOW (ref 135–145)
TOTAL PROTEIN: 7.5 g/dL (ref 6.5–8.1)

## 2016-04-23 LAB — CBC WITH DIFFERENTIAL/PLATELET
Basophils Absolute: 0 K/uL (ref 0–0.1)
Basophils Relative: 0 %
Eosinophils Absolute: 0 K/uL (ref 0–0.7)
Eosinophils Relative: 0 %
HCT: 27.4 % — ABNORMAL LOW (ref 40.0–52.0)
Hemoglobin: 8.9 g/dL — ABNORMAL LOW (ref 13.0–18.0)
Lymphocytes Relative: 16 %
Lymphs Abs: 1.6 K/uL (ref 1.0–3.6)
MCH: 26.4 pg (ref 26.0–34.0)
MCHC: 32.5 g/dL (ref 32.0–36.0)
MCV: 81.1 fL (ref 80.0–100.0)
Monocytes Absolute: 1.1 K/uL — ABNORMAL HIGH (ref 0.2–1.0)
Monocytes Relative: 11 %
Neutro Abs: 7.4 K/uL — ABNORMAL HIGH (ref 1.4–6.5)
Neutrophils Relative %: 73 %
Platelets: 581 K/uL — ABNORMAL HIGH (ref 150–440)
RBC: 3.38 MIL/uL — ABNORMAL LOW (ref 4.40–5.90)
RDW: 20.1 % — ABNORMAL HIGH (ref 11.5–14.5)
WBC: 10.2 K/uL (ref 3.8–10.6)

## 2016-04-23 LAB — PSA: PSA: 477 ng/mL — AB (ref 0.00–4.00)

## 2016-04-23 MED ORDER — FENTANYL 50 MCG/HR TD PT72
50.0000 ug | MEDICATED_PATCH | TRANSDERMAL | Status: DC
Start: 2016-04-23 — End: 2016-05-14

## 2016-04-23 MED ORDER — SODIUM CHLORIDE 0.9 % IV SOLN
Freq: Once | INTRAVENOUS | Status: AC
Start: 2016-04-23 — End: 2016-04-23
  Administered 2016-04-23: 11:00:00 via INTRAVENOUS
  Filled 2016-04-23: qty 1000

## 2016-04-23 MED ORDER — OXYCODONE HCL 10 MG PO TABS: 10.0000 mg | ORAL_TABLET | Freq: Four times a day (QID) | ORAL | Status: DC | PRN

## 2016-04-23 MED ORDER — HEPARIN SOD (PORK) LOCK FLUSH 100 UNIT/ML IV SOLN
500.0000 [IU] | Freq: Once | INTRAVENOUS | Status: AC
  Administered 2016-04-23: 500 [IU] via INTRAVENOUS
  Filled 2016-04-23: qty 5

## 2016-04-23 MED ORDER — SODIUM CHLORIDE 0.9% FLUSH
10.0000 mL | INTRAVENOUS | Status: DC | PRN
  Administered 2016-04-23: 10 mL via INTRAVENOUS
  Filled 2016-04-23: qty 10

## 2016-04-23 MED ORDER — PEGFILGRASTIM 6 MG/0.6ML ~~LOC~~ PSKT
6.0000 mg | PREFILLED_SYRINGE | Freq: Once | SUBCUTANEOUS | Status: AC
  Administered 2016-04-23: 6 mg via SUBCUTANEOUS
  Filled 2016-04-23: qty 0.6

## 2016-04-23 MED ORDER — DENOSUMAB 120 MG/1.7ML ~~LOC~~ SOLN
120.0000 mg | Freq: Once | SUBCUTANEOUS | Status: AC
  Administered 2016-04-23: 120 mg via SUBCUTANEOUS
  Filled 2016-04-23: qty 1.7

## 2016-04-23 MED ORDER — DEGARELIX ACETATE 80 MG ~~LOC~~ SOLR
80.0000 mg | Freq: Once | SUBCUTANEOUS | Status: AC
  Administered 2016-04-23: 80 mg via SUBCUTANEOUS
  Filled 2016-04-23: qty 4

## 2016-04-23 MED ORDER — DOCETAXEL CHEMO INJECTION 160 MG/16ML
70.0000 mg/m2 | Freq: Once | INTRAVENOUS | Status: AC
  Administered 2016-04-23: 130 mg via INTRAVENOUS
  Filled 2016-04-23: qty 13

## 2016-04-23 MED ORDER — SODIUM CHLORIDE 0.9 % IV SOLN
Freq: Once | INTRAVENOUS | Status: AC
  Administered 2016-04-23: 11:00:00 via INTRAVENOUS
  Filled 2016-04-23: qty 5

## 2016-04-23 MED ORDER — PALONOSETRON HCL INJECTION 0.25 MG/5ML
0.2500 mg | Freq: Once | INTRAVENOUS | Status: AC
  Administered 2016-04-23: 0.25 mg via INTRAVENOUS
  Filled 2016-04-23: qty 5

## 2016-04-23 NOTE — Assessment & Plan Note (Signed)
Hemoglobin 8.5; multifactorial. Check CBC on a weekly basis; plan blood transfusion if needed.

## 2016-04-23 NOTE — Assessment & Plan Note (Signed)
Continued weight loss likely secondary to malignancy. Recommend increased protein intake. Also await CT of the abdomen pelvis for further recommendations.

## 2016-04-23 NOTE — Assessment & Plan Note (Addendum)
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 PSA - stable around 240. Awaiting the PSA from today. Recommend repeating CT chest abdomen pelvis to evaluate the metastatic disease in the liver.

## 2016-04-23 NOTE — Assessment & Plan Note (Signed)
CBC shows anemia hemoglobin 8.9 [C discussion above]; otherwise CMP unremarkable-except for elevated alkaline phosphatase likely secondary to progressive malignancy. Proceed with cycle #8 of chemotherapy/Taxotere.

## 2016-04-23 NOTE — Assessment & Plan Note (Signed)
Worsening right-sided abdominal pain; question related to progressive malignancy. Given a new prescription for fentanyl; short-acting pain medication. Await CT of the abdomen pelvis.

## 2016-04-23 NOTE — Progress Notes (Signed)
Patient requesting refill for Fentanyl 50 mcg and Oxycodone 10 mg.

## 2016-04-23 NOTE — Progress Notes (Signed)
[place] OFFICE PROGRESS NOTE  Patient Care Team: Pcp Not In System as PCP - General  Prostate cancer metastatic to multiple sites [facility])   Staging form: Prostate, AJCC 7th Edition     Clinical: Stage IV (T3, N0, M1b, PSA: 20 or greater, Gleason 8-10 - Poorly differentiated/undifferentiated (marked anaplasia)) - Signed by Forest Gleason, MD on 03/17/2015    Oncology History   1.MRI scan on August 4 of 2015  revealed abnormal liver lesion and multiple areas  of   abnormallity  in the bone suggestive off metastases  2.High PSA. 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  Patient has been started on [facility] (August, 2015) and Delton See, 4.patient started on [facility] protocol with Gillermina Phy nd ZYTIGA (May, 2016)  # JAN 2017- TAXOTERE q 3W     Prostate cancer metastatic to multiple sites [facility])   03/11/2015 Initial Diagnosis Prostate cancer metastatic to multiple sites      INTERVAL HISTORY:  This is my first interaction with the patient as patient's primary oncologist has been Dr.Choksi. I reviewed the patient's prior charts/pertinent labs/imaging in detail; findings are summarized above.   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  Patient overall feels poorly. He is a poor appetite. He is losing weight. Complains of worsening pain in his right upper quadrant. Complains of nausea. No vomiting.  He denies any significant tingling and numbness of his extremities. However he does complain of gait imbalance for which he uses a cane. Denies any swelling in the legs.  REVIEW OF SYSTEMS:  A complete 10 point review of system is done which is negative except mentioned above/history of present illness.   PAST MEDICAL HISTORY :  Past Medical  History  Diagnosis Date  . Hyperlipidemia   . Prostate cancer ([place])     2014  . Bone cancer ([place])     PAST SURGICAL HISTORY :   Past Surgical History  Procedure Laterality Date  . Ankle surgery Right   . Peripheral vascular catheterization N/A 11/26/2015    Procedure: Glori Luis Cath Insertion;  Surgeon: Katha Cabal, MD;  Location: [place] [facility];  Service: Cardiovascular;  Laterality: N/A;    FAMILY HISTORY :  No family history on file.  SOCIAL HISTORY:   Social History  Substance Use Topics  . Smoking status: Former Smoker -- 0.50 packs/day for 45 years    Types: Cigarettes    Quit date: 11/13/1991  . Smokeless tobacco: Not on file  . Alcohol Use: 29.4 oz/week    6 Cans of beer, 43 Shots of liquor per week    ALLERGIES:  is allergic to no known allergies.  MEDICATIONS:  Current Outpatient Prescriptions  Medication Sig Dispense Refill  . benzonatate (TESSALON) 100 MG capsule Take 1 capsule (100 mg total) by mouth 3 (three) times daily. 45 capsule 0  . fentaNYL (DURAGESIC - DOSED MCG/HR) 50 MCG/HR Place 1 patch (50 mcg total) onto the skin every 3 (three) days. 10 patch 0  . ferrous gluconate (FERGON) 324 MG tablet Take 1 tablet (324 mg total) by mouth daily with breakfast. 30 tablet 3  . lactose free nutrition (BOOST) LIQD Take 237 mLs by mouth 2 (two) times daily between meals. 60 Can 3  . lidocaine-prilocaine (EMLA) cream Apply cream 1 hour before  chemotherapy treatment and apply saran wrap over cream to protect clothing 30 g 1  . loratadine (CLARITIN) 10 MG tablet Take 1 tablet (10 mg total) by mouth daily as needed for allergies. 30 tablet 3  . ondansetron (ZOFRAN) 4 MG tablet Take 1 tablet (4 mg total) by mouth every 6 (six) hours as needed. 30 tablet 3  . Oxycodone HCl 10 MG TABS Take 1 tablet (10 mg total) by mouth every 6 (six) hours as needed. 60 tablet 0  . predniSONE (DELTASONE) 5 MG tablet TAKE 1 TABLET (5 MG TOTAL) BY MOUTH 2 (TWO) TIMES DAILY. 60 tablet  3  . ranitidine (ACID CONTROL MAXIMUM STRENGTH) 150 MG tablet Take 1 tablet (150 mg total) by mouth at bedtime. 30 tablet 3   No current facility-administered medications for this visit.    PHYSICAL EXAMINATION: ECOG PERFORMANCE STATUS: 2 - Symptomatic, <50% confined to bed  BP 123/75 mmHg  Pulse 88  Temp(Src) 97.4 F (36.3 C) (Tympanic)  Resp 18  Wt 145 lb 1 oz (65.8 kg)  Filed Weights   04/23/16 1006  Weight: 145 lb 1 oz (65.8 kg)    GENERAL: Moderately nourished moderately built; Alert, no distress and comfortable.   Walking with a cane. A complete the family. EYES: Positive for pallor; no icterus OROPHARYNX: no thrush or ulceration; good dentition  NECK: supple, no masses felt LYMPH:  no palpable lymphadenopathy in the cervical, axillary or inguinal regions LUNGS: clear to auscultation and  No wheeze or crackles HEART/CVS: regular rate & rhythm and no murmurs; No lower extremity edema ABDOMEN:abdomen soft, non-tender and normal bowel sounds; positive for hepatomegaly Musculoskeletal:no cyanosis of digits and no clubbing  PSYCH: alert & oriented x 3 with fluent speech NEURO: no focal motor/sensory deficits SKIN:  no rashes or significant lesions  LABORATORY DATA:  I have reviewed the data as listed    Component Value Date/Time   NA 134* 04/23/2016 0849   NA 129* 02/22/2015 0932   K 4.5 04/23/2016 0849   K 4.4 02/22/2015 0932   CL 99* 04/23/2016 0849   CL 96* 02/22/2015 0932   CO2 26 04/23/2016 0849   CO2 26 02/22/2015 0932   GLUCOSE 140* 04/23/2016 0849   GLUCOSE 111* 02/22/2015 0932   BUN 24* 04/23/2016 0849   BUN 25* 02/22/2015 0932   CREATININE 1.14 04/23/2016 0849   CREATININE 0.96 02/22/2015 0932   CALCIUM 8.6* 04/23/2016 0849   CALCIUM 7.7* 02/22/2015 0932   PROT 7.5 04/23/2016 0849   PROT 8.0 02/22/2015 0932   ALBUMIN 3.0* 04/23/2016 0849   ALBUMIN 3.3* 02/22/2015 0932   AST 47* 04/23/2016 0849   AST 65* 02/22/2015 0932   ALT 48 04/23/2016 0849    ALT 63 02/22/2015 0932   ALKPHOS 283* 04/23/2016 0849   ALKPHOS 256* 02/22/2015 0932   BILITOT 0.7 04/23/2016 0849   BILITOT 1.0 02/22/2015 0932   GFRNONAA >60 04/23/2016 0849   GFRNONAA >60 02/22/2015 0932   GFRNONAA >60 11/20/2014 0940   GFRAA >60 04/23/2016 0849   GFRAA >60 02/22/2015 0932   GFRAA >60 11/20/2014 0940    No results found for: SPEP, UPEP  Lab Results  Component Value Date   WBC 10.2 04/23/2016   NEUTROABS 7.4* 04/23/2016   HGB 8.9* 04/23/2016   HCT 27.4* 04/23/2016   MCV 81.1 04/23/2016   PLT 581* 04/23/2016      Chemistry      Component Value Date/Time   NA 134* 04/23/2016 [id number]  NA 129* 02/22/2015 0932   K 4.5 04/23/2016 0849   K 4.4 02/22/2015 0932   CL 99* 04/23/2016 0849   CL 96* 02/22/2015 0932   CO2 26 04/23/2016 0849   CO2 26 02/22/2015 0932   BUN 24* 04/23/2016 0849   BUN 25* 02/22/2015 0932   CREATININE 1.14 04/23/2016 0849   CREATININE 0.96 02/22/2015 0932      Component Value Date/Time   CALCIUM 8.6* 04/23/2016 0849   CALCIUM 7.7* 02/22/2015 0932   ALKPHOS 283* 04/23/2016 0849   ALKPHOS 256* 02/22/2015 0932   AST 47* 04/23/2016 0849   AST 65* 02/22/2015 0932   ALT 48 04/23/2016 0849   ALT 63 02/22/2015 0932   BILITOT 0.7 04/23/2016 0849   BILITOT 1.0 02/22/2015 0932       RADIOGRAPHIC STUDIES: I have personally reviewed the radiological images as listed and agreed with the findings in the report. No results found.   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 PSA - stable around 240. Awaiting the PSA from today. Recommend repeating CT chest abdomen pelvis to evaluate the metastatic disease in the liver.   Neoplasm related pain Worsening right-sided abdominal pain; question related to progressive malignancy. Given a new prescription for fentanyl; short-acting pain medication. Await CT of the abdomen pelvis.  Normocytic  anemia Hemoglobin 8.5; multifactorial. Check CBC on a weekly basis; plan blood transfusion if needed.  Weight loss, abnormal Continued weight loss likely secondary to malignancy. Recommend increased protein intake. Also await CT of the abdomen pelvis for further recommendations.  Encounter for antineoplastic chemotherapy CBC shows anemia hemoglobin 8.9 [C discussion above]; otherwise CMP unremarkable-except for elevated alkaline phosphatase likely secondary to progressive malignancy. Proceed with cycle #8 of chemotherapy/Taxotere.     Orders Placed This Encounter  Procedures  . CT Abdomen Pelvis W Contrast    Standing Status: Future     Number of Occurrences:      Standing Expiration Date: 07/23/2017    Order Specific Question:  Reason for Exam (SYMPTOM  OR DIAGNOSIS REQUIRED)    Answer:  prostate cancer- metastatic    Order Specific Question:  Preferred imaging location?    Answer:  [facility]  . CBC with Differential    Standing Status: Future     Number of Occurrences:      Standing Expiration Date: 04/23/2017  . Comprehensive metabolic panel    Standing Status: Future     Number of Occurrences:      Standing Expiration Date: 04/23/2017    Order Specific Question:  Has the patient fasted?    Answer:  No  . CBC with Differential    Standing Status: Future     Number of Occurrences:      Standing Expiration Date: 04/23/2017  . Basic metabolic panel    Standing Status: Future     Number of Occurrences:      Standing Expiration Date: 04/23/2017    Order Specific Question:  Has the patient fasted?    Answer:  No  . CBC with Differential    Standing Status: Future     Number of Occurrences:      Standing Expiration Date: 04/23/2017  . Basic metabolic panel    Standing Status: Future     Number of Occurrences:      Standing Expiration Date: 04/23/2017    Order Specific Question:  Has the patient fasted?    Answer:  No   All questions were answered. The patient knows to  call the clinic with any problems, questions or concerns. Follow-up CBC BMP on a weekly basis; CT scan in approximately 2 weeks follow-up with me in 3 weeks.      Cammie Sickle, MD 04/23/2016 8:55 PM

## 2016-04-24 ENCOUNTER — Inpatient Hospital Stay: Payer: Medicare Other

## 2016-04-24 DIAGNOSIS — Z5111 Encounter for antineoplastic chemotherapy: Secondary | ICD-10-CM | POA: Diagnosis not present

## 2016-04-24 DIAGNOSIS — C61 Malignant neoplasm of prostate: Secondary | ICD-10-CM

## 2016-04-24 IMAGING — NM NM BONE WHOLE BODY
2 series · 6 of 6 positions shown · non-contrast
Comparison: Baseline bone scan 02/01/2015, bone scan 07/04/2015
COMPARISON: 07/04/2015.  02/01/2015.

CLINICAL DATA: Prostate cancer.  Metastatic disease.

EXAM:
NUCLEAR MEDICINE WHOLE BODY BONE SCAN
TECHNIQUE: Whole body anterior and posterior images were obtained approximately
3 hours after intravenous injection of radiopharmaceutical.
RADIOPHARMACEUTICALS:  23.8 mCi Wechnetium-SSm MDP IV

[Series 1000: statics · 2.40mm/px · 2 acquisitions, 4 frames shown]
[im 1/2]
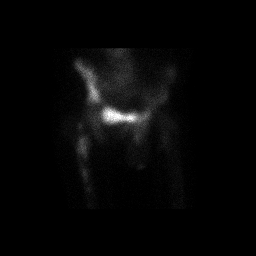
[im 1/2]
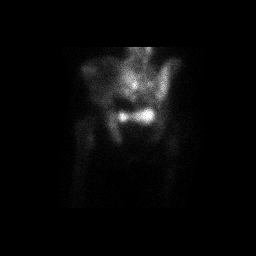
[im 2/2]
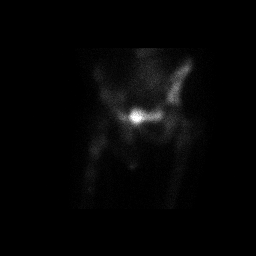
[im 2/2]
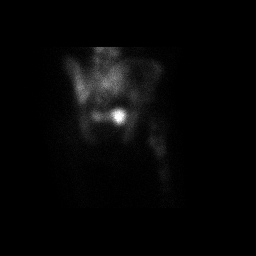

[Series 1000: 3 hr wholebody · 2.40mm/px · 2 of 2 frames shown]
[frame 1/2]
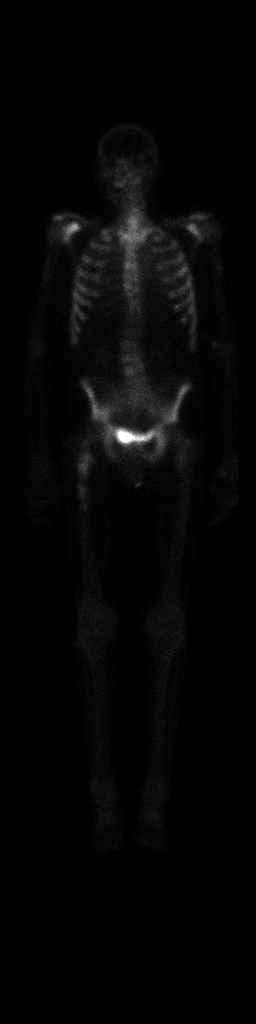
[frame 2/2]
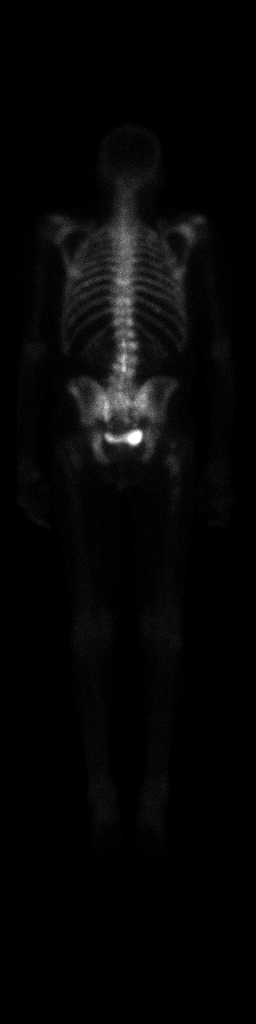

[6 of 6 positions shown; findings below may reference images not displayed]

MSRWY Measurements for Bone Metastasis:

1. No evidence of new skeletal lesions.
2. Widespread skeletal metastasis unchanged from baseline exam of
02/01/2015.Is
FINDINGS: Faint visualization of the kidneys. Progressive increased uptake
noted throughout the thoracolumbar spine, pelvis, proximal femurs as
well as ribs bilaterally. Findings consistent with progressive
metastatic disease .
IMPRESSION: Findings consistent with progressive metastatic disease.

## 2016-04-24 MED ORDER — PEGFILGRASTIM INJECTION 6 MG/0.6ML ~~LOC~~
6.0000 mg | PREFILLED_SYRINGE | Freq: Once | SUBCUTANEOUS | Status: AC
  Administered 2016-04-24: 6 mg via SUBCUTANEOUS
  Filled 2016-04-24: qty 0.6

## 2016-04-30 ENCOUNTER — Inpatient Hospital Stay: Payer: Medicare Other

## 2016-04-30 DIAGNOSIS — C61 Malignant neoplasm of prostate: Secondary | ICD-10-CM

## 2016-04-30 DIAGNOSIS — Z5111 Encounter for antineoplastic chemotherapy: Secondary | ICD-10-CM | POA: Diagnosis not present

## 2016-04-30 LAB — BASIC METABOLIC PANEL
Anion gap: 9 (ref 5–15)
BUN: 27 mg/dL — AB (ref 6–20)
CALCIUM: 9 mg/dL (ref 8.9–10.3)
CO2: 27 mmol/L (ref 22–32)
CREATININE: 1.22 mg/dL (ref 0.61–1.24)
Chloride: 97 mmol/L — ABNORMAL LOW (ref 101–111)
GFR calc Af Amer: >60 mL/min (ref >60)
GFR, EST NON AFRICAN AMERICAN: 56 mL/min — AB (ref >60)
GLUCOSE: 143 mg/dL — AB (ref 65–99)
Potassium: 4.5 mmol/L (ref 3.5–5.1)
SODIUM: 133 mmol/L — AB (ref 135–145)

## 2016-04-30 LAB — CBC WITH DIFFERENTIAL/PLATELET
Basophils Absolute: 0.1 10*3/uL (ref 0–0.1)
Basophils Relative: 0 %
EOS ABS: 0 10*3/uL (ref 0–0.7)
HCT: 28.1 % — ABNORMAL LOW (ref 40.0–52.0)
Hemoglobin: 8.9 g/dL — ABNORMAL LOW (ref 13.0–18.0)
LYMPHS ABS: 1.9 10*3/uL (ref 1.0–3.6)
MCH: 26 pg (ref 26.0–34.0)
MCHC: 31.5 g/dL — ABNORMAL LOW (ref 32.0–36.0)
MCV: 82.3 fL (ref 80.0–100.0)
MONO ABS: 1.3 10*3/uL — AB (ref 0.2–1.0)
Monocytes Relative: 10 %
Neutro Abs: 10.6 10*3/uL — ABNORMAL HIGH (ref 1.4–6.5)
Neutrophils Relative %: 76 %
PLATELETS: 342 10*3/uL (ref 150–440)
RBC: 3.42 MIL/uL — ABNORMAL LOW (ref 4.40–5.90)
RDW: 20.7 % — AB (ref 11.5–14.5)
WBC: 14 10*3/uL — AB (ref 3.8–10.6)

## 2016-05-04 ENCOUNTER — Other Ambulatory Visit: Payer: Self-pay | Admitting: Oncology

## 2016-05-07 ENCOUNTER — Ambulatory Visit
Admission: RE | Admit: 2016-05-07 | Discharge: 2016-05-07 | Disposition: A | Discharge status: Home / Self Care | Payer: Medicare Other | Source: Ambulatory Visit | Attending: Internal Medicine | Admitting: Internal Medicine

## 2016-05-07 ENCOUNTER — Inpatient Hospital Stay: Payer: Medicare Other

## 2016-05-07 DIAGNOSIS — R160 Hepatomegaly, not elsewhere classified: Secondary | ICD-10-CM | POA: Diagnosis not present

## 2016-05-07 DIAGNOSIS — G893 Neoplasm related pain (acute) (chronic): Secondary | ICD-10-CM | POA: Diagnosis present

## 2016-05-07 DIAGNOSIS — Z5111 Encounter for antineoplastic chemotherapy: Secondary | ICD-10-CM | POA: Diagnosis not present

## 2016-05-07 DIAGNOSIS — C61 Malignant neoplasm of prostate: Secondary | ICD-10-CM | POA: Insufficient documentation

## 2016-05-07 DIAGNOSIS — R634 Abnormal weight loss: Secondary | ICD-10-CM | POA: Diagnosis present

## 2016-05-07 DIAGNOSIS — R911 Solitary pulmonary nodule: Secondary | ICD-10-CM | POA: Insufficient documentation

## 2016-05-07 DIAGNOSIS — C7951 Secondary malignant neoplasm of bone: Secondary | ICD-10-CM | POA: Diagnosis not present

## 2016-05-07 LAB — CBC WITH DIFFERENTIAL/PLATELET
BASOS ABS: 0 10*3/uL (ref 0–0.1)
BASOS PCT: 0 %
EOS ABS: 0 10*3/uL (ref 0–0.7)
EOS PCT: 0 %
HEMATOCRIT: 28.5 % — AB (ref 40.0–52.0)
Hemoglobin: 9.1 g/dL — ABNORMAL LOW (ref 13.0–18.0)
Lymphocytes Relative: 11 %
Lymphs Abs: 2.6 10*3/uL (ref 1.0–3.6)
MCH: 25.9 pg — ABNORMAL LOW (ref 26.0–34.0)
MCHC: 32 g/dL (ref 32.0–36.0)
MCV: 81 fL (ref 80.0–100.0)
MONO ABS: 0.9 10*3/uL (ref 0.2–1.0)
Monocytes Relative: 4 %
NEUTROS ABS: 20.6 10*3/uL — AB (ref 1.4–6.5)
Neutrophils Relative %: 85 %
PLATELETS: 331 10*3/uL (ref 150–440)
RBC: 3.51 MIL/uL — ABNORMAL LOW (ref 4.40–5.90)
RDW: 21.4 % — AB (ref 11.5–14.5)
WBC: 24.1 10*3/uL — ABNORMAL HIGH (ref 3.8–10.6)

## 2016-05-07 LAB — BASIC METABOLIC PANEL
ANION GAP: 9 (ref 5–15)
BUN: 19 mg/dL (ref 6–20)
CALCIUM: 8.4 mg/dL — AB (ref 8.9–10.3)
CO2: 26 mmol/L (ref 22–32)
CREATININE: 1.19 mg/dL (ref 0.61–1.24)
Chloride: 98 mmol/L — ABNORMAL LOW (ref 101–111)
GFR, EST NON AFRICAN AMERICAN: 58 mL/min — AB (ref >60)
Glucose, Bld: 109 mg/dL — ABNORMAL HIGH (ref 65–99)
Potassium: 4.9 mmol/L (ref 3.5–5.1)
SODIUM: 133 mmol/L — AB (ref 135–145)

## 2016-05-07 MED ORDER — IOPAMIDOL (ISOVUE-300) INJECTION 61%
85.0000 mL | Freq: Once | INTRAVENOUS | Status: AC | PRN
  Administered 2016-05-07: 85 mL via INTRAVENOUS

## 2016-05-13 ENCOUNTER — Other Ambulatory Visit: Payer: Self-pay | Admitting: Oncology

## 2016-05-14 ENCOUNTER — Inpatient Hospital Stay: Payer: Medicare Other | Attending: Internal Medicine

## 2016-05-14 ENCOUNTER — Inpatient Hospital Stay: Payer: Medicare Other

## 2016-05-14 ENCOUNTER — Inpatient Hospital Stay (HOSPITAL_BASED_OUTPATIENT_CLINIC_OR_DEPARTMENT_OTHER): Payer: Medicare Other | Admitting: Internal Medicine

## 2016-05-14 VITALS — BP 152/102 | HR 125 | Temp 98.1°F | Wt 135.8 lb

## 2016-05-14 VITALS — BP 119/77 | HR 88

## 2016-05-14 DIAGNOSIS — E860 Dehydration: Secondary | ICD-10-CM | POA: Insufficient documentation

## 2016-05-14 DIAGNOSIS — G893 Neoplasm related pain (acute) (chronic): Secondary | ICD-10-CM

## 2016-05-14 DIAGNOSIS — Z87891 Personal history of nicotine dependence: Secondary | ICD-10-CM | POA: Diagnosis not present

## 2016-05-14 DIAGNOSIS — Z79899 Other long term (current) drug therapy: Secondary | ICD-10-CM | POA: Insufficient documentation

## 2016-05-14 DIAGNOSIS — R000 Tachycardia, unspecified: Secondary | ICD-10-CM | POA: Diagnosis not present

## 2016-05-14 DIAGNOSIS — Z5111 Encounter for antineoplastic chemotherapy: Secondary | ICD-10-CM | POA: Diagnosis not present

## 2016-05-14 DIAGNOSIS — C787 Secondary malignant neoplasm of liver and intrahepatic bile duct: Secondary | ICD-10-CM | POA: Diagnosis not present

## 2016-05-14 DIAGNOSIS — C61 Malignant neoplasm of prostate: Secondary | ICD-10-CM | POA: Diagnosis not present

## 2016-05-14 DIAGNOSIS — R634 Abnormal weight loss: Secondary | ICD-10-CM

## 2016-05-14 DIAGNOSIS — Z7689 Persons encountering health services in other specified circumstances: Secondary | ICD-10-CM | POA: Diagnosis not present

## 2016-05-14 DIAGNOSIS — D649 Anemia, unspecified: Secondary | ICD-10-CM

## 2016-05-14 LAB — CBC WITH DIFFERENTIAL/PLATELET
BASOS PCT: 1 %
Basophils Absolute: 0.1 10*3/uL (ref 0–0.1)
EOS ABS: 0 10*3/uL (ref 0–0.7)
EOS PCT: 0 %
HEMATOCRIT: 28.6 % — AB (ref 40.0–52.0)
Hemoglobin: 9 g/dL — ABNORMAL LOW (ref 13.0–18.0)
Lymphocytes Relative: 18 %
Lymphs Abs: 2.4 10*3/uL (ref 1.0–3.6)
MCH: 25.2 pg — ABNORMAL LOW (ref 26.0–34.0)
MCHC: 31.4 g/dL — AB (ref 32.0–36.0)
MCV: 80.1 fL (ref 80.0–100.0)
MONO ABS: 1.1 10*3/uL — AB (ref 0.2–1.0)
MONOS PCT: 8 %
Neutro Abs: 9.9 10*3/uL — ABNORMAL HIGH (ref 1.4–6.5)
Neutrophils Relative %: 73 %
Platelets: 450 10*3/uL — ABNORMAL HIGH (ref 150–440)
RBC: 3.57 MIL/uL — ABNORMAL LOW (ref 4.40–5.90)
RDW: 22 % — AB (ref 11.5–14.5)
WBC: 13.6 10*3/uL — ABNORMAL HIGH (ref 3.8–10.6)

## 2016-05-14 LAB — COMPREHENSIVE METABOLIC PANEL
ALBUMIN: 3.2 g/dL — AB (ref 3.5–5.0)
ALT: 21 U/L (ref 17–63)
ANION GAP: 6 (ref 5–15)
AST: 29 U/L (ref 15–41)
Alkaline Phosphatase: 229 U/L — ABNORMAL HIGH (ref 38–126)
BILIRUBIN TOTAL: 0.4 mg/dL (ref 0.3–1.2)
BUN: 26 mg/dL — ABNORMAL HIGH (ref 6–20)
CALCIUM: 8.6 mg/dL — AB (ref 8.9–10.3)
CO2: 26 mmol/L (ref 22–32)
Chloride: 101 mmol/L (ref 101–111)
Creatinine, Ser: 1.28 mg/dL — ABNORMAL HIGH (ref 0.61–1.24)
GFR calc Af Amer: >60 mL/min (ref >60)
GFR calc non Af Amer: 53 mL/min — ABNORMAL LOW (ref >60)
GLUCOSE: 143 mg/dL — AB (ref 65–99)
POTASSIUM: 3.9 mmol/L (ref 3.5–5.1)
SODIUM: 133 mmol/L — AB (ref 135–145)
TOTAL PROTEIN: 7.6 g/dL (ref 6.5–8.1)

## 2016-05-14 MED ORDER — DIPHENOXYLATE-ATROPINE 2.5-0.025 MG PO TABS: 1.0000 | ORAL_TABLET | Freq: Four times a day (QID) | ORAL | Status: AC | PRN

## 2016-05-14 MED ORDER — FENTANYL 50 MCG/HR TD PT72: 50.0000 ug | MEDICATED_PATCH | TRANSDERMAL | Status: DC

## 2016-05-14 MED ORDER — HEPARIN SOD (PORK) LOCK FLUSH 100 UNIT/ML IV SOLN
500.0000 [IU] | Freq: Once | INTRAVENOUS | Status: AC
  Administered 2016-05-14: 500 [IU] via INTRAVENOUS
  Filled 2016-05-14: qty 5

## 2016-05-14 MED ORDER — SODIUM CHLORIDE 0.9 % IV SOLN
Freq: Once | INTRAVENOUS | Status: AC
  Administered 2016-05-14: 11:00:00 via INTRAVENOUS
  Filled 2016-05-14: qty 4

## 2016-05-14 MED ORDER — DEXAMETHASONE 4 MG PO TABS: ORAL_TABLET | ORAL | Status: AC

## 2016-05-14 MED ORDER — SODIUM CHLORIDE 0.9 % IV SOLN
Freq: Once | INTRAVENOUS | Status: AC
  Administered 2016-05-14: 11:00:00 via INTRAVENOUS
  Filled 2016-05-14: qty 1000

## 2016-05-14 MED ORDER — SODIUM CHLORIDE 0.9% FLUSH
10.0000 mL | INTRAVENOUS | Status: DC | PRN
  Administered 2016-05-14: 10 mL via INTRAVENOUS
  Filled 2016-05-14: qty 10

## 2016-05-14 MED ORDER — OXYCODONE HCL 10 MG PO TABS: 10.0000 mg | ORAL_TABLET | Freq: Four times a day (QID) | ORAL | Status: DC | PRN

## 2016-05-14 NOTE — Assessment & Plan Note (Addendum)
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 PSA - increasing to 477/ CT- liver progression on Docetaxl. Start cabazitaxel q 3 w- 15 mg/m2. discused high risk of SEs; if significant complications from chemo recommend hospice.   # Discussed the potential side effects including but not limited to-increasing fatigue, nausea vomiting, diarrhea, hair loss, sores in the mouth, increase risk of infection and also neuropathy. Discussed re: diarrhea/lomotil/immodium. .  Growth factor-Neulasta/On pro would be given as prophylaxis for chemotherapy-induced neutropenia to prevent febrile neutropenias.  # pain from neoplasm- o new scripts give;  # anemia-  hold tube/ possible; tachycardia- dehydration- plans IVFs today.   # weekly labs/   Follow up in 3 weeks/   #  I reviewed the images myself and with the patient and family in detail. A copy of this report was given.

## 2016-05-14 NOTE — Patient Instructions (Signed)
Loperamide oral solution What is this medicine? LOPERAMIDE (loe PER a mide) is used to treat diarrhea. This medicine may be used for other purposes; ask your health care provider or pharmacist if you have questions. What should I tell my health care provider before I take this medicine? They need to know if you have any of these conditions: -a black or bloody stool -bacterial food poisoning -colitis or mucus in your stool -currently taking an antibiotic medication for an infection -fever -liver disease -severe abdominal pain, swelling or bulging -an unusual or allergic reaction to loperamide, other medicines, foods, dyes, or preservatives -pregnant or trying to get pregnant -breast-feeding How should I use this medicine? Take this medicine by mouth. Follow the directions on the prescription label. Use a specially marked spoon or container to measure your medicine. Ask your pharmacist if you do not have one. Household spoons are not accurate. Take your medicine at regular intervals. Do not take your medicine more often than directed. Talk to your pediatrician regarding the use of this medicine in children. While this drug may be prescribed for children as young as 6 years for selected conditions, precautions do apply. Overdosage: If you think you have taken too much of this medicine contact a poison control center or emergency room at once. NOTE: This medicine is only for you. Do not share this medicine with others. What if I miss a dose? This does not apply. This medicine is not for regular use. Only take this medicine while you continue to have loose bowel movements. Do not take more medicine than recommended by the packaging label or by your healthcare professional. What may interact with this medicine? Do not take this medicine with any of the following medications: - alosetron This medicine may also interact with the following  medications: -cimetidine -clarithromycin -erythromycin -gemfibrozil -itraconazole -ketoconazole -quinidine -quinine -ranitidine -ritonavir -saquinavir This list may not describe all possible interactions. Give your health care provider a list of all the medicines, herbs, non-prescription drugs, or dietary supplements you use. Also tell them if you smoke, drink alcohol, or use illegal drugs. Some items may interact with your medicine. What should I watch for while using this medicine? Do not take this medicine for more than 2 days without asking your doctor or health care professional. Do not use doses higher than those prescribed by your doctor or listed on the label. Check with your doctor or health care professional right away if you develop a fever, severe abdominal pain, swelling or bulging, or if you have have bloody/black diarrhea or stools. You may get drowsy or dizzy. Do not drive, use machinery, or do anything that needs mental alertness until you know how this medicine affects you. Do not stand or sit up quickly, especially if you are an older patient. This reduces the risk of dizzy or fainting spells. Alcohol can increase possible drowsiness and dizziness. Avoid alcoholic drinks. Your mouth may get dry. Chewing sugarless gum or sucking hard candy, and drinking plenty of water may help. Contact your doctor if the problem does not go away or is severe. Drinking plenty of water can also help prevent dehydration that can occur with diarrhea. Elderly patients may have a more variable response to the effects of this medicine, and are more susceptible to the effects of dehydration. What side effects may I notice from receiving this medicine? Side effects that you should report to your doctor or health care professional as soon as possible: - allergic reactions like skin  rash, itching or hives, swelling of the face, lips, or tongue -bloated, swollen feeling in your abdomen -blurred  vision -loss of appetite -signs and symptoms of a dangerous change in heartbeat or heart rhythm like chest pain; dizziness; fast or irregular heartbeat palpitations; feeling faint or lightheaded, falls; breathing problems -stomach pain Side effects that usually do not require medical attention (report to your doctor or health care professional if they continue or are bothersome): - constipation -drowsiness or dizziness -dry mouth -nausea, vomiting This list may not describe all possible side effects. Call your doctor for medical advice about side effects. You may report side effects to FDA at 1-800-FDA-1088. Where should I keep my medicine? Keep out of the reach of children. Store at room temperature between 20 and 25 degrees C (68 and 77 degrees F). Do not freeze. Keep container tightly closed. Throw away any unused medicine after the expiration date. NOTE: This sheet is a summary. It may not cover all possible information. If you have questions about this medicine, talk to your doctor, pharmacist, or health care provider.    2016, Elsevier/Gold Standard. (2015-04-23 15:29:28)      Atropine; Diphenoxylate oral liquid What is this medicine? ATROPINE; DIPHENOXYLATE (A troe peen dye fen OX i late) is used to treat diarrhea. This medicine may be used for other purposes; ask your health care provider or pharmacist if you have questions. What should I tell my health care provider before I take this medicine? They need to know if you have any of these conditions: -bacterial food poisoning -colitis -dehydration -Down's syndrome -jaundice or liver disease -an unusual or allergic reaction to atropine, diphenoxylate, other medicines, foods, dyes, or preservatives -pregnant or trying to get pregnant -breast-feeding How should I use this medicine? Take this medicine by mouth. Follow the directions on the prescription label. Use a specially marked spoon or dropper to measure your medicine.  Ask your pharmacist if you do not have one. Household spoons are not accurate. For children, only use a specially marked dropper to measure the liquid. Take your medicine at regular intervals. Do not take it more often than directed. Once your diarrhea has been brought under control your doctor or health care professional may reduce your doses. Talk to your pediatrician regarding the use of this medicine in children. While this drug may be prescribed for children as young as 30 years old for selected conditions, precautions do apply. Overdosage: If you think you have taken too much of this medicine contact a poison control center or emergency room at once. NOTE: This medicine is only for you. Do not share this medicine with others. What if I miss a dose? If you miss a dose, take it as soon as you can. If it is almost time for your next dose, take only that dose. Do not take double or extra doses. What may interact with this medicine? -alcohol -antihistamines for allergy, cough and cold -barbiturate medicines for inducing sleep or treating seizures -certain medicines for depression, anxiety, or psychotic disturbances -certain medicines for sleep -medicines for movement abnormalities as in Parkinson's disease, or for gastrointestinal problems -muscle relaxants -narcotic medicines (opiates) for pain This list may not describe all possible interactions. Give your health care provider a list of all the medicines, herbs, non-prescription drugs, or dietary supplements you use. Also tell them if you smoke, drink alcohol, or use illegal drugs. Some items may interact with your medicine. What should I watch for while using this medicine? If your symptoms  do not start to get better after taking this medicine for two days, check with your doctor or health care professional, you may have a problem that needs further evaluation. Check with your doctor or health care professional right away if you develop a fever  or bloody diarrhea. You may get drowsy or dizzy. Do not drive, use machinery, or do anything that needs mental alertness until you know how this medicine affects you. Do not stand or sit up quickly, especially if you are an older patient. This reduces the risk of dizzy or fainting spells. Alcohol may interfere with the effect of this medicine. Avoid alcoholic drinks. Your mouth may get dry. Chewing sugarless gum or sucking hard candy, and drinking plenty of water may help. Contact your doctor if the problem does not go away or is severe. Drinking plenty of water can also help prevent dehydration that can occur with diarrhea. What side effects may I notice from receiving this medicine? Side effects that you should report to your doctor or health care professional as soon as possible: -allergic reactions like skin rash, itching or hives, swelling of the face, lips, or tongue -bloated, swollen feeling -breathing problems -changes in vision -fast, irregular heartbeat -stomach pain Side effects that usually do not require medical attention (report to your doctor or health care professional if they continue or are bothersome): -headache -loss of appetite -mood changes -nausea, vomiting -pain, tingling, numbness in the hands or feet This list may not describe all possible side effects. Call your doctor for medical advice about side effects. You may report side effects to FDA at 1-800-FDA-1088. Where should I keep my medicine? Keep out of the reach of children. This medicine can be abused. Keep your medicine in a safe place to protect it from theft. Do not share this medicine with anyone. Selling or giving away this medicine is dangerous and against the law. This medicine may cause accidental overdose and death if taken by other adults, children, or pets. Mix any unused medicine with a substance like cat litter or coffee grounds. Then throw the medicine away in a sealed container like a sealed bag or a  coffee can with a lid. Do not use the medicine after the expiration date. Store at room temperature between 15 and 30 degrees C (59 and 86 degrees F). Do not freeze. Protect from light. Keep container tightly closed. NOTE: This sheet is a summary. It may not cover all possible information. If you have questions about this medicine, talk to your doctor, pharmacist, or health care provider.    2016, Elsevier/Gold Standard. (2014-07-17 16:06:55)

## 2016-05-14 NOTE — Progress Notes (Signed)
Patient states appetite is not good. Requesting refill for oxycodone and fentanyl.

## 2016-05-14 NOTE — Progress Notes (Signed)
[place] OFFICE PROGRESS NOTE  Patient Care Team: Pcp Not In System as PCP - General  Prostate cancer metastatic to multiple sites [facility])   Staging form: Prostate, AJCC 7th Edition     Clinical: Stage IV (T3, N0, M1b, PSA: 20 or greater, Gleason 8-10 - Poorly differentiated/undifferentiated (marked anaplasia)) - Signed by Forest Gleason, MD on 03/17/2015    Oncology History   1.MRI scan on August 4 of 2015  revealed abnormal liver lesion and multiple areas  of   abnormallity  in the bone suggestive off metastases 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  Patient has been started on [facility] (August, 2015) and Delton See, 4.patient started on Alliance protocol with Gillermina Phy nd ZYTIGA (May, 2016)  # JAN 2017- TAXOTERE q 3W  # June 29th  2017- PROGRESSION in liver; PSA-       Prostate cancer metastatic to multiple sites [facility])   03/11/2015 Initial Diagnosis Prostate cancer metastatic to multiple sites      INTERVAL HISTORY:  A very pleasant 75 year old African-American male patient with above history of metastatic prostate cancer castrate resistant currently on Taxotere since January 2017 is here for follow-up.   Patient overall feels poorly. He is a poor appetite. He is losing weight. Complains of worsening pain in his right upper quadrant- Requesting pain medication. Complains of nausea. No vomiting.  He denies any significant tingling and numbness of his extremities. However he does complain of gait imbalance for which he uses a cane. Denies any swelling in the legs. He is in a wheelchair.  REVIEW OF SYSTEMS:  A complete 10 point review of system is done which is negative except mentioned above/history of present illness.   PAST MEDICAL HISTORY :  Past Medical History  Diagnosis Date  . Hyperlipidemia   . Prostate cancer ([place])     2014  . Bone cancer ([place])     PAST  SURGICAL HISTORY :   Past Surgical History  Procedure Laterality Date  . Ankle surgery Right   . Peripheral vascular catheterization N/A 11/26/2015    Procedure: Glori Luis Cath Insertion;  Surgeon: Katha Cabal, MD;  Location: [place] [facility];  Service: Cardiovascular;  Laterality: N/A;    FAMILY HISTORY :  No family history on file.  SOCIAL HISTORY:   Social History  Substance Use Topics  . Smoking status: Former Smoker -- 0.50 packs/day for 45 years    Types: Cigarettes    Quit date: 11/13/1991  . Smokeless tobacco: Not on file  . Alcohol Use: 29.4 oz/week    6 Cans of beer, 43 Shots of liquor per week    ALLERGIES:  is allergic to no known allergies.  MEDICATIONS:  Current Outpatient Prescriptions  Medication Sig Dispense Refill  . benzonatate (TESSALON) 100 MG capsule Take 1 capsule (100 mg total) by mouth 3 (three) times daily. 45 capsule 0  . fentaNYL (DURAGESIC - DOSED MCG/HR) 50 MCG/HR Place 1 patch (50 mcg total) onto the skin every 3 (three) days. 10 patch 0  . ferrous gluconate (FERGON) 324 MG tablet Take 1 tablet (324 mg total) by mouth daily with breakfast. 30 tablet 3  . lactose free nutrition (BOOST) LIQD Take 237 mLs by mouth 2 (two) times daily between meals. 60 Can 3  . lidocaine-prilocaine (EMLA) cream Apply cream 1 hour before chemotherapy treatment and apply saran wrap over cream to protect clothing 30  g 1  . loratadine (CLARITIN) 10 MG tablet Take 1 tablet (10 mg total) by mouth daily as needed for allergies. 30 tablet 3  . ondansetron (ZOFRAN) 4 MG tablet Take 1 tablet (4 mg total) by mouth every 6 (six) hours as needed. 30 tablet 3  . Oxycodone HCl 10 MG TABS Take 1 tablet (10 mg total) by mouth every 6 (six) hours as needed. 60 tablet 0  . predniSONE (DELTASONE) 5 MG tablet TAKE 1 TABLET (5 MG TOTAL) BY MOUTH 2 (TWO) TIMES DAILY. 60 tablet 3  . ranitidine (ZANTAC) 150 MG tablet TAKE 1 TABLET (150 MG TOTAL) BY MOUTH AT BEDTIME. 30 tablet 3  .  dexamethasone (DECADRON) 4 MG tablet Take one pill AM & PM x 3 days; start the day prior to chemo. 60 tablet 0  . diphenoxylate-atropine (LOMOTIL) 2.5-0.025 MG tablet Take 1 tablet by mouth 4 (four) times daily as needed for diarrhea or loose stools. 30 tablet 0   No current facility-administered medications for this visit.    PHYSICAL EXAMINATION: ECOG PERFORMANCE STATUS: 2 - Symptomatic, <50% confined to bed  BP 152/102 mmHg  Pulse 125  Temp(Src) 98.1 F (36.7 C) (Tympanic)  Wt 135 lb 12.9 oz (61.6 kg)  Filed Weights   05/14/16 0937  Weight: 135 lb 12.9 oz (61.6 kg)    GENERAL: Moderately nourished moderately built; Alert, no distress and comfortable. He is in a wheelchair. Accompanied by his ex-wife. EYES: Positive for pallor; no icterus OROPHARYNX: no thrush or ulceration; good dentition  NECK: supple, no masses felt LYMPH:  no palpable lymphadenopathy in the cervical, axillary or inguinal regions LUNGS: clear to auscultation and  No wheeze or crackles HEART/CVS: regular rate & rhythm and no murmurs; No lower extremity edema ABDOMEN:abdomen soft, non-tender and normal bowel sounds; positive for hepatomegaly Musculoskeletal:no cyanosis of digits and no clubbing  PSYCH: alert & oriented x 3 with fluent speech NEURO: no focal motor/sensory deficits SKIN:  no rashes or significant lesions  LABORATORY DATA:  I have reviewed the data as listed    Component Value Date/Time   NA 133* 05/14/2016 0917   NA 129* 02/22/2015 0932   K 3.9 05/14/2016 0917   K 4.4 02/22/2015 0932   CL 101 05/14/2016 0917   CL 96* 02/22/2015 0932   CO2 26 05/14/2016 0917   CO2 26 02/22/2015 0932   GLUCOSE 143* 05/14/2016 0917   GLUCOSE 111* 02/22/2015 0932   BUN 26* 05/14/2016 0917   BUN 25* 02/22/2015 0932   CREATININE 1.28* 05/14/2016 0917   CREATININE 0.96 02/22/2015 0932   CALCIUM 8.6* 05/14/2016 0917   CALCIUM 7.7* 02/22/2015 0932   PROT 7.6 05/14/2016 0917   PROT 8.0 02/22/2015 0932    ALBUMIN 3.2* 05/14/2016 0917   ALBUMIN 3.3* 02/22/2015 0932   AST 29 05/14/2016 0917   AST 65* 02/22/2015 0932   ALT 21 05/14/2016 0917   ALT 63 02/22/2015 0932   ALKPHOS 229* 05/14/2016 0917   ALKPHOS 256* 02/22/2015 0932   BILITOT 0.4 05/14/2016 0917   BILITOT 1.0 02/22/2015 0932   GFRNONAA 53* 05/14/2016 0917   GFRNONAA >60 02/22/2015 0932   GFRNONAA >60 11/20/2014 0940   GFRAA >60 05/14/2016 0917   GFRAA >60 02/22/2015 0932   GFRAA >60 11/20/2014 0940    No results found for: SPEP, UPEP  Lab Results  Component Value Date   WBC 13.6* 05/14/2016   NEUTROABS 9.9* 05/14/2016   HGB 9.0* 05/14/2016   HCT 28.6* 05/14/2016  MCV 80.1 05/14/2016   PLT 450* 05/14/2016      Chemistry      Component Value Date/Time   NA 133* 05/14/2016 0917   NA 129* 02/22/2015 0932   K 3.9 05/14/2016 0917   K 4.4 02/22/2015 0932   CL 101 05/14/2016 0917   CL 96* 02/22/2015 0932   CO2 26 05/14/2016 0917   CO2 26 02/22/2015 0932   BUN 26* 05/14/2016 0917   BUN 25* 02/22/2015 0932   CREATININE 1.28* 05/14/2016 0917   CREATININE 0.96 02/22/2015 0932      Component Value Date/Time   CALCIUM 8.6* 05/14/2016 0917   CALCIUM 7.7* 02/22/2015 0932   ALKPHOS 229* 05/14/2016 0917   ALKPHOS 256* 02/22/2015 0932   AST 29 05/14/2016 0917   AST 65* 02/22/2015 0932   ALT 21 05/14/2016 0917   ALT 63 02/22/2015 0932   BILITOT 0.4 05/14/2016 0917   BILITOT 1.0 02/22/2015 0932       RADIOGRAPHIC STUDIES: I have personally reviewed the radiological images as listed and agreed with the findings in the report. No results found.   ASSESSMENT & PLAN:  Prostate cancer metastatic to multiple sites 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 PSA - increasing to 477/ CT- liver progression on Docetaxl. Start cabazitaxel q 3 w- 15 mg/m2. discused high risk of SEs; if significant complications from chemo recommend hospice.   # Discussed the potential side effects including  but not limited to-increasing fatigue, nausea vomiting, diarrhea, hair loss, sores in the mouth, increase risk of infection and also neuropathy. Discussed re: diarrhea/lomotil/immodium. .  Growth factor-Neulasta/On pro would be given as prophylaxis for chemotherapy-induced neutropenia to prevent febrile neutropenias.  # pain from neoplasm- o new scripts give;  # anemia-  hold tube/ possible; tachycardia- dehydration- plans IVFs today.   # weekly labs/   Follow up in 3 weeks/   #  I reviewed the images myself and with the patient and family in detail. A copy of this report was given.      Orders Placed This Encounter  Procedures  . CBC with Differential    Standing Status: Standing     Number of Occurrences: 2     Standing Expiration Date: 05/14/2017  . Basic metabolic panel    Standing Status: Standing     Number of Occurrences: 2     Standing Expiration Date: 05/14/2017  . Hold Tube- Blood Bank    Standing Status: Standing     Number of Occurrences: 2     Standing Expiration Date: 05/14/2017       Cammie Sickle, MD 05/14/2016 7:31 PM

## 2016-05-21 ENCOUNTER — Ambulatory Visit: Payer: Medicare Other

## 2016-05-21 ENCOUNTER — Inpatient Hospital Stay: Payer: Medicare Other

## 2016-05-21 VITALS — BP 108/73 | HR 91 | Temp 98.2°F | Resp 18

## 2016-05-21 DIAGNOSIS — Z5111 Encounter for antineoplastic chemotherapy: Secondary | ICD-10-CM | POA: Diagnosis not present

## 2016-05-21 DIAGNOSIS — D649 Anemia, unspecified: Secondary | ICD-10-CM

## 2016-05-21 DIAGNOSIS — C61 Malignant neoplasm of prostate: Secondary | ICD-10-CM

## 2016-05-21 LAB — SAMPLE TO BLOOD BANK

## 2016-05-21 LAB — CBC WITH DIFFERENTIAL/PLATELET
Basophils Absolute: 0.1 10*3/uL (ref 0–0.1)
Basophils Relative: 0 %
EOS PCT: 0 %
Eosinophils Absolute: 0 10*3/uL (ref 0–0.7)
HCT: 25.8 % — ABNORMAL LOW (ref 40.0–52.0)
HEMOGLOBIN: 8.2 g/dL — AB (ref 13.0–18.0)
LYMPHS ABS: 1.7 10*3/uL (ref 1.0–3.6)
LYMPHS PCT: 13 %
MCH: 25 pg — AB (ref 26.0–34.0)
MCHC: 31.9 g/dL — ABNORMAL LOW (ref 32.0–36.0)
MCV: 78.5 fL — AB (ref 80.0–100.0)
Monocytes Absolute: 1.1 10*3/uL — ABNORMAL HIGH (ref 0.2–1.0)
Monocytes Relative: 9 %
Neutro Abs: 10.2 10*3/uL — ABNORMAL HIGH (ref 1.4–6.5)
Neutrophils Relative %: 78 %
PLATELETS: 492 10*3/uL — AB (ref 150–440)
RBC: 3.29 MIL/uL — AB (ref 4.40–5.90)
RDW: 21.5 % — ABNORMAL HIGH (ref 11.5–14.5)
WBC: 13.1 10*3/uL — AB (ref 3.8–10.6)

## 2016-05-21 LAB — BASIC METABOLIC PANEL
Anion gap: 6 (ref 5–15)
BUN: 27 mg/dL — AB (ref 6–20)
CHLORIDE: 98 mmol/L — AB (ref 101–111)
CO2: 26 mmol/L (ref 22–32)
Calcium: 8.5 mg/dL — ABNORMAL LOW (ref 8.9–10.3)
Creatinine, Ser: 1.13 mg/dL (ref 0.61–1.24)
GFR calc Af Amer: >60 mL/min (ref >60)
GFR calc non Af Amer: >60 mL/min (ref >60)
GLUCOSE: 138 mg/dL — AB (ref 65–99)
POTASSIUM: 4.2 mmol/L (ref 3.5–5.1)
Sodium: 130 mmol/L — ABNORMAL LOW (ref 135–145)

## 2016-05-21 MED ORDER — PEGFILGRASTIM 6 MG/0.6ML ~~LOC~~ PSKT
6.0000 mg | PREFILLED_SYRINGE | Freq: Once | SUBCUTANEOUS | Status: AC
Start: 2016-05-21 — End: 2016-05-21
  Administered 2016-05-21: 6 mg via SUBCUTANEOUS
  Filled 2016-05-21: qty 0.6

## 2016-05-21 MED ORDER — DENOSUMAB 120 MG/1.7ML ~~LOC~~ SOLN
120.0000 mg | Freq: Once | SUBCUTANEOUS | Status: AC
  Administered 2016-05-21: 120 mg via SUBCUTANEOUS
  Filled 2016-05-21: qty 1.7

## 2016-05-21 MED ORDER — DEXTROSE 5 % IV SOLN
15.0000 mg/m2 | Freq: Once | INTRAVENOUS | Status: AC
  Administered 2016-05-21: 26 mg via INTRAVENOUS
  Filled 2016-05-21: qty 2.6

## 2016-05-21 MED ORDER — DEGARELIX ACETATE 80 MG ~~LOC~~ SOLR
80.0000 mg | Freq: Once | SUBCUTANEOUS | Status: AC
  Administered 2016-05-21: 80 mg via SUBCUTANEOUS
  Filled 2016-05-21: qty 4

## 2016-05-21 MED ORDER — FAMOTIDINE IN NACL 20-0.9 MG/50ML-% IV SOLN
20.0000 mg | Freq: Once | INTRAVENOUS | Status: AC
  Administered 2016-05-21: 20 mg via INTRAVENOUS
  Filled 2016-05-21: qty 50

## 2016-05-21 MED ORDER — SODIUM CHLORIDE 0.9 % IV SOLN
10.0000 mg | Freq: Once | INTRAVENOUS | Status: AC
  Administered 2016-05-21: 10 mg via INTRAVENOUS
  Filled 2016-05-21: qty 1

## 2016-05-21 MED ORDER — HEPARIN SOD (PORK) LOCK FLUSH 100 UNIT/ML IV SOLN
500.0000 [IU] | Freq: Once | INTRAVENOUS | Status: AC
  Administered 2016-05-21: 500 [IU] via INTRAVENOUS
  Filled 2016-05-21: qty 5

## 2016-05-21 MED ORDER — SODIUM CHLORIDE 0.9 % IV SOLN
Freq: Once | INTRAVENOUS | Status: AC
  Administered 2016-05-21: 10:00:00 via INTRAVENOUS
  Filled 2016-05-21: qty 1000

## 2016-05-21 MED ORDER — SODIUM CHLORIDE 0.9% FLUSH
10.0000 mL | INTRAVENOUS | Status: DC | PRN
  Administered 2016-05-21: 10 mL via INTRAVENOUS
  Filled 2016-05-21: qty 10

## 2016-05-21 MED ORDER — DIPHENHYDRAMINE HCL 50 MG/ML IJ SOLN
25.0000 mg | Freq: Once | INTRAMUSCULAR | Status: AC
  Administered 2016-05-21: 25 mg via INTRAVENOUS
  Filled 2016-05-21: qty 1

## 2016-05-21 NOTE — Progress Notes (Signed)
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 PSA - increasing to 477/ CT- liver progression on Docetaxl. Start cabazitaxel q 3 w- 15 mg/m2. discused high risk of SEs; if significant complications from chemo recommend hospice.

## 2016-05-27 ENCOUNTER — Telehealth: Payer: Self-pay | Admitting: *Deleted

## 2016-05-27 DIAGNOSIS — C61 Malignant neoplasm of prostate: Secondary | ICD-10-CM

## 2016-05-27 MED ORDER — LORATADINE 10 MG PO TABS: 10.0000 mg | ORAL_TABLET | Freq: Every day | ORAL | Status: AC | PRN

## 2016-05-27 NOTE — Telephone Encounter (Signed)
Received request for refill on loratadine from patients pharmacy, refill sent to [facility].

## 2016-05-28 ENCOUNTER — Inpatient Hospital Stay: Payer: Medicare Other

## 2016-05-28 DIAGNOSIS — Z5111 Encounter for antineoplastic chemotherapy: Secondary | ICD-10-CM | POA: Diagnosis not present

## 2016-05-28 DIAGNOSIS — C61 Malignant neoplasm of prostate: Secondary | ICD-10-CM

## 2016-05-28 DIAGNOSIS — D649 Anemia, unspecified: Secondary | ICD-10-CM

## 2016-05-28 LAB — SAMPLE TO BLOOD BANK

## 2016-05-28 LAB — CBC WITH DIFFERENTIAL/PLATELET
Basophils Absolute: 0.2 10*3/uL — ABNORMAL HIGH (ref 0–0.1)
Basophils Relative: 1 %
EOS PCT: 0 %
Eosinophils Absolute: 0 10*3/uL (ref 0–0.7)
HEMATOCRIT: 27.2 % — AB (ref 40.0–52.0)
Hemoglobin: 8.4 g/dL — ABNORMAL LOW (ref 13.0–18.0)
LYMPHS ABS: 2.7 10*3/uL (ref 1.0–3.6)
LYMPHS PCT: 6 %
MCH: 24.4 pg — AB (ref 26.0–34.0)
MCHC: 30.7 g/dL — ABNORMAL LOW (ref 32.0–36.0)
MCV: 79.4 fL — AB (ref 80.0–100.0)
MONO ABS: 1.5 10*3/uL — AB (ref 0.2–1.0)
Monocytes Relative: 3 %
NEUTROS ABS: 41.3 10*3/uL — AB (ref 1.4–6.5)
Neutrophils Relative %: 90 %
PLATELETS: 433 10*3/uL (ref 150–440)
RBC: 3.43 MIL/uL — AB (ref 4.40–5.90)
RDW: 22.2 % — AB (ref 11.5–14.5)
WBC: 45.7 10*3/uL — ABNORMAL HIGH (ref 3.8–10.6)

## 2016-05-28 LAB — BASIC METABOLIC PANEL
Anion gap: 6 (ref 5–15)
BUN: 20 mg/dL (ref 6–20)
CALCIUM: 8.7 mg/dL — AB (ref 8.9–10.3)
CO2: 25 mmol/L (ref 22–32)
CREATININE: 1.34 mg/dL — AB (ref 0.61–1.24)
Chloride: 100 mmol/L — ABNORMAL LOW (ref 101–111)
GFR calc Af Amer: 58 mL/min — ABNORMAL LOW (ref >60)
GFR, EST NON AFRICAN AMERICAN: 50 mL/min — AB (ref >60)
GLUCOSE: 134 mg/dL — AB (ref 65–99)
POTASSIUM: 4.6 mmol/L (ref 3.5–5.1)
Sodium: 131 mmol/L — ABNORMAL LOW (ref 135–145)

## 2016-05-28 MED ORDER — SODIUM CHLORIDE 0.9 % IV SOLN
Freq: Once | INTRAVENOUS | Status: AC
  Administered 2016-05-28: 10:00:00 via INTRAVENOUS
  Filled 2016-05-28: qty 1000

## 2016-05-28 MED ORDER — SODIUM CHLORIDE 0.9% FLUSH
10.0000 mL | INTRAVENOUS | Status: DC | PRN
  Administered 2016-05-28: 10 mL via INTRAVENOUS
  Filled 2016-05-28: qty 10

## 2016-05-28 MED ORDER — HEPARIN SOD (PORK) LOCK FLUSH 100 UNIT/ML IV SOLN
500.0000 [IU] | Freq: Once | INTRAVENOUS | Status: AC
  Administered 2016-05-28: 500 [IU] via INTRAVENOUS
  Filled 2016-05-28: qty 5

## 2016-06-04 ENCOUNTER — Other Ambulatory Visit: Payer: Self-pay | Admitting: *Deleted

## 2016-06-04 ENCOUNTER — Inpatient Hospital Stay: Payer: Medicare Other

## 2016-06-04 DIAGNOSIS — C61 Malignant neoplasm of prostate: Secondary | ICD-10-CM

## 2016-06-04 DIAGNOSIS — Z5111 Encounter for antineoplastic chemotherapy: Secondary | ICD-10-CM | POA: Diagnosis not present

## 2016-06-04 LAB — CBC WITH DIFFERENTIAL/PLATELET
BASOS PCT: 0 %
Basophils Absolute: 0 10*3/uL (ref 0–0.1)
EOS ABS: 0 10*3/uL (ref 0–0.7)
Eosinophils Relative: 0 %
HCT: 28.9 % — ABNORMAL LOW (ref 40.0–52.0)
HEMOGLOBIN: 9.1 g/dL — AB (ref 13.0–18.0)
LYMPHS PCT: 12 %
Lymphs Abs: 2.9 10*3/uL (ref 1.0–3.6)
MCH: 25 pg — ABNORMAL LOW (ref 26.0–34.0)
MCHC: 31.3 g/dL — ABNORMAL LOW (ref 32.0–36.0)
MCV: 79.9 fL — ABNORMAL LOW (ref 80.0–100.0)
MONOS PCT: 5 %
Monocytes Absolute: 1.2 10*3/uL — ABNORMAL HIGH (ref 0.2–1.0)
NEUTROS ABS: 20.3 10*3/uL — AB (ref 1.4–6.5)
NEUTROS PCT: 83 %
Platelets: 301 10*3/uL (ref 150–440)
RBC: 3.62 MIL/uL — AB (ref 4.40–5.90)
RDW: 24.5 % — ABNORMAL HIGH (ref 11.5–14.5)
WBC: 24.4 10*3/uL — ABNORMAL HIGH (ref 3.8–10.6)

## 2016-06-04 LAB — BASIC METABOLIC PANEL WITH GFR
Anion gap: 7 (ref 5–15)
BUN: 21 mg/dL — ABNORMAL HIGH (ref 6–20)
CO2: 24 mmol/L (ref 22–32)
Calcium: 8.1 mg/dL — ABNORMAL LOW (ref 8.9–10.3)
Chloride: 103 mmol/L (ref 101–111)
Creatinine, Ser: 1.43 mg/dL — ABNORMAL HIGH (ref 0.61–1.24)
GFR calc Af Amer: 54 mL/min — ABNORMAL LOW
GFR calc non Af Amer: 46 mL/min — ABNORMAL LOW
Glucose, Bld: 113 mg/dL — ABNORMAL HIGH (ref 65–99)
Potassium: 4.3 mmol/L (ref 3.5–5.1)
Sodium: 134 mmol/L — ABNORMAL LOW (ref 135–145)

## 2016-06-04 LAB — SAMPLE TO BLOOD BANK

## 2016-06-04 MED ORDER — SODIUM CHLORIDE 0.9 % IV SOLN
Freq: Once | INTRAVENOUS | Status: AC
  Administered 2016-06-04: 10:00:00 via INTRAVENOUS
  Filled 2016-06-04: qty 1000

## 2016-06-04 MED ORDER — SODIUM CHLORIDE 0.9 % IV SOLN
Freq: Once | INTRAVENOUS | Status: AC
Start: 2016-06-04 — End: 2016-06-04
  Administered 2016-06-04: 10:00:00 via INTRAVENOUS
  Filled 2016-06-04: qty 1000

## 2016-06-04 MED ORDER — HEPARIN SOD (PORK) LOCK FLUSH 100 UNIT/ML IV SOLN
500.0000 [IU] | Freq: Once | INTRAVENOUS | Status: AC | PRN
  Administered 2016-06-04: 500 [IU]
  Filled 2016-06-04: qty 5

## 2016-06-04 MED ORDER — SODIUM CHLORIDE 0.9% FLUSH
10.0000 mL | INTRAVENOUS | Status: DC | PRN
  Filled 2016-06-04: qty 10

## 2016-06-10 ENCOUNTER — Other Ambulatory Visit: Payer: Self-pay | Admitting: *Deleted

## 2016-06-10 DIAGNOSIS — C61 Malignant neoplasm of prostate: Secondary | ICD-10-CM

## 2016-06-11 ENCOUNTER — Inpatient Hospital Stay: Payer: Medicare Other

## 2016-06-11 ENCOUNTER — Inpatient Hospital Stay: Payer: Medicare Other | Attending: Internal Medicine | Admitting: Internal Medicine

## 2016-06-11 VITALS — BP 111/78 | HR 103 | Temp 98.3°F | Resp 18 | Wt 134.3 lb

## 2016-06-11 DIAGNOSIS — R944 Abnormal results of kidney function studies: Secondary | ICD-10-CM | POA: Diagnosis not present

## 2016-06-11 DIAGNOSIS — D649 Anemia, unspecified: Secondary | ICD-10-CM | POA: Diagnosis not present

## 2016-06-11 DIAGNOSIS — C787 Secondary malignant neoplasm of liver and intrahepatic bile duct: Secondary | ICD-10-CM | POA: Diagnosis not present

## 2016-06-11 DIAGNOSIS — E860 Dehydration: Secondary | ICD-10-CM | POA: Insufficient documentation

## 2016-06-11 DIAGNOSIS — Z87891 Personal history of nicotine dependence: Secondary | ICD-10-CM | POA: Diagnosis not present

## 2016-06-11 DIAGNOSIS — G893 Neoplasm related pain (acute) (chronic): Secondary | ICD-10-CM | POA: Insufficient documentation

## 2016-06-11 DIAGNOSIS — R000 Tachycardia, unspecified: Secondary | ICD-10-CM | POA: Diagnosis not present

## 2016-06-11 DIAGNOSIS — C61 Malignant neoplasm of prostate: Secondary | ICD-10-CM

## 2016-06-11 DIAGNOSIS — Z79899 Other long term (current) drug therapy: Secondary | ICD-10-CM | POA: Diagnosis not present

## 2016-06-11 DIAGNOSIS — Z5111 Encounter for antineoplastic chemotherapy: Secondary | ICD-10-CM | POA: Insufficient documentation

## 2016-06-11 DIAGNOSIS — Z66 Do not resuscitate: Secondary | ICD-10-CM | POA: Insufficient documentation

## 2016-06-11 DIAGNOSIS — E785 Hyperlipidemia, unspecified: Secondary | ICD-10-CM | POA: Insufficient documentation

## 2016-06-11 DIAGNOSIS — R634 Abnormal weight loss: Secondary | ICD-10-CM

## 2016-06-11 LAB — CBC WITH DIFFERENTIAL/PLATELET
BASOS PCT: 0 %
Basophils Absolute: 0 10*3/uL (ref 0–0.1)
EOS ABS: 0 10*3/uL (ref 0–0.7)
Eosinophils Relative: 0 %
HEMATOCRIT: 25.9 % — AB (ref 40.0–52.0)
HEMOGLOBIN: 8.3 g/dL — AB (ref 13.0–18.0)
LYMPHS ABS: 2.1 10*3/uL (ref 1.0–3.6)
Lymphocytes Relative: 14 %
MCH: 25.3 pg — AB (ref 26.0–34.0)
MCHC: 31.9 g/dL — AB (ref 32.0–36.0)
MCV: 79.3 fL — ABNORMAL LOW (ref 80.0–100.0)
MONOS PCT: 9 %
Monocytes Absolute: 1.4 10*3/uL — ABNORMAL HIGH (ref 0.2–1.0)
NEUTROS ABS: 11.4 10*3/uL — AB (ref 1.4–6.5)
NEUTROS PCT: 77 %
Platelets: 363 10*3/uL (ref 150–440)
RBC: 3.27 MIL/uL — AB (ref 4.40–5.90)
RDW: 24.1 % — ABNORMAL HIGH (ref 11.5–14.5)
WBC: 14.9 10*3/uL — AB (ref 3.8–10.6)

## 2016-06-11 LAB — BASIC METABOLIC PANEL
ANION GAP: 7 (ref 5–15)
BUN: 25 mg/dL — ABNORMAL HIGH (ref 6–20)
CHLORIDE: 100 mmol/L — AB (ref 101–111)
CO2: 24 mmol/L (ref 22–32)
CREATININE: 1.8 mg/dL — AB (ref 0.61–1.24)
Calcium: 8.2 mg/dL — ABNORMAL LOW (ref 8.9–10.3)
GFR calc non Af Amer: 35 mL/min — ABNORMAL LOW (ref >60)
GFR, EST AFRICAN AMERICAN: 41 mL/min — AB (ref >60)
Glucose, Bld: 137 mg/dL — ABNORMAL HIGH (ref 65–99)
Potassium: 4.9 mmol/L (ref 3.5–5.1)
SODIUM: 131 mmol/L — AB (ref 135–145)

## 2016-06-11 LAB — SAMPLE TO BLOOD BANK

## 2016-06-11 MED ORDER — FAMOTIDINE IN NACL 20-0.9 MG/50ML-% IV SOLN
20.0000 mg | Freq: Once | INTRAVENOUS | Status: AC
  Administered 2016-06-11: 20 mg via INTRAVENOUS

## 2016-06-11 MED ORDER — SODIUM CHLORIDE 0.9% FLUSH
10.0000 mL | INTRAVENOUS | Status: DC | PRN
  Administered 2016-06-11: 10 mL via INTRAVENOUS
  Filled 2016-06-11: qty 10

## 2016-06-11 MED ORDER — ONDANSETRON HCL 4 MG PO TABS: 4.0000 mg | ORAL_TABLET | Freq: Four times a day (QID) | ORAL | 3 refills | Status: AC | PRN

## 2016-06-11 MED ORDER — PEGFILGRASTIM 6 MG/0.6ML ~~LOC~~ PSKT
6.0000 mg | PREFILLED_SYRINGE | Freq: Once | SUBCUTANEOUS | Status: AC
  Administered 2016-06-11: 6 mg via SUBCUTANEOUS
  Filled 2016-06-11: qty 0.6

## 2016-06-11 MED ORDER — FENTANYL 50 MCG/HR TD PT72: 50.0000 ug | MEDICATED_PATCH | TRANSDERMAL | 0 refills | Status: AC

## 2016-06-11 MED ORDER — OXYCODONE HCL 10 MG PO TABS: 10.0000 mg | ORAL_TABLET | Freq: Four times a day (QID) | ORAL | 0 refills | Status: AC | PRN

## 2016-06-11 MED ORDER — CABAZITAXEL CHEMO INJECTION 60 MG/6ML W/DILUENT
15.0000 mg/m2 | Freq: Once | INTRAVENOUS | Status: AC
  Administered 2016-06-11: 26 mg via INTRAVENOUS
  Filled 2016-06-11: qty 2.6

## 2016-06-11 MED ORDER — SODIUM CHLORIDE 0.9 % IV SOLN
Freq: Once | INTRAVENOUS | Status: AC
Start: 2016-06-11 — End: 2016-06-11
  Administered 2016-06-11: 11:00:00 via INTRAVENOUS
  Filled 2016-06-11: qty 1000

## 2016-06-11 MED ORDER — DIPHENHYDRAMINE HCL 50 MG/ML IJ SOLN
25.0000 mg | Freq: Once | INTRAMUSCULAR | Status: AC
  Administered 2016-06-11: 25 mg via INTRAVENOUS
  Filled 2016-06-11: qty 1

## 2016-06-11 MED ORDER — HEPARIN SOD (PORK) LOCK FLUSH 100 UNIT/ML IV SOLN
500.0000 [IU] | Freq: Once | INTRAVENOUS | Status: AC
  Administered 2016-06-11: 500 [IU] via INTRAVENOUS
  Filled 2016-06-11: qty 5

## 2016-06-11 MED ORDER — SODIUM CHLORIDE 0.9 % IV SOLN
Freq: Once | INTRAVENOUS | Status: AC
  Administered 2016-06-11: 11:00:00 via INTRAVENOUS
  Filled 2016-06-11: qty 1000

## 2016-06-11 MED ORDER — RANITIDINE HCL 150 MG PO TABS: ORAL_TABLET | ORAL | 3 refills | Status: DC

## 2016-06-11 MED ORDER — DEXAMETHASONE SODIUM PHOSPHATE 100 MG/10ML IJ SOLN
10.0000 mg | Freq: Once | INTRAMUSCULAR | Status: AC
  Administered 2016-06-11: 10 mg via INTRAVENOUS
  Filled 2016-06-11: qty 1

## 2016-06-11 NOTE — Progress Notes (Signed)
Patient accompanied by wife today who states patient is confused.  Also states he is coughing up phlegm (white).  Also states appetite is decreased.  Requesting refills for Zantac, Zofran, Fentanyl and oxycodone.

## 2016-06-11 NOTE — Progress Notes (Signed)
Creatinine: 1.8. MD, Dr. Rogue Bussing notified via telephone and already aware. Proceed with treatment today per MD order.

## 2016-06-11 NOTE — Assessment & Plan Note (Addendum)
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 PSA - increasing to 477/ CT- liver progression on Docetaxl. Tolerated cycle #1 of cabazitaxel without any major side effects.  # s/p cabazitaxel q 3 w- 15 mg/m2.  Proceed with cycle # 2 today.cont Firmagon q monthly. Labs were okay except for anemia hemoglobin 8.5 and creatinine 1.8.  # pain from neoplasm- stable. New scripts given.  # anemia-  hold tube/ possible; transfusion.   # tachycardia- dehydration- plans IVFs today. Creat-1.8/ discussed hospice if clinically worse or poor tol to chemo.   # weekly labs/ IVFs.   Follow up in 3 weeks/ labs/chemo/firmagon.

## 2016-06-11 NOTE — Progress Notes (Signed)
[place] OFFICE PROGRESS NOTE  Patient Care Team: Pcp Not In System as PCP - General  Prostate cancer metastatic to multiple sites [facility]   Staging form: Prostate, AJCC 7th Edition     Clinical: Stage IV (T3, N0, M1b, PSA: 20 or greater, Gleason 8-10 - Poorly differentiated/undifferentiated (marked anaplasia)) - Signed by Forest Gleason, MD on 03/17/2015    Oncology History   1.MRI scan on August 4 of 2015  revealed abnormal liver lesion and multiple areas  of   abnormallity  in the bone suggestive off metastases 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  Patient has been started on [facility] (August, 2015) and Delton See, 4.patient started on Alliance protocol with Gillermina Phy nd ZYTIGA (May, 2016)  # JAN 2017- TAXOTERE q 3W  # June 29th  2017- PROGRESSION in liver; PSA-       Prostate cancer metastatic to multiple sites [facility])   03/11/2015 Initial Diagnosis    Prostate cancer metastatic to multiple sites        INTERVAL HISTORY:  A very pleasant 75 year old African-American male patient with above history of metastatic prostate cancer castrate resistant On cabazitaxel status post cycle 1 is here for follow-up  Patient appetite continues to be poor. Patient's pain right upper quadrant is stable. Patient did not have any significant nausea or vomiting with the chemotherapy. No diarrhea. As per his ex-wife- he has not been eating and drinking well. Complains of fatigue.  He denies any significant tingling and numbness of his extremities. However he does complain of gait imbalance for which he uses a cane. Denies any swelling in the legs. He is in a wheelchair.  REVIEW OF SYSTEMS:  A complete 10 point review of system is done which is negative except mentioned above/history of present illness.   PAST MEDICAL HISTORY :  Past Medical History:  Diagnosis Date  . Bone cancer ([place])    . Hyperlipidemia   . Prostate cancer ([place])    2014    PAST SURGICAL HISTORY :   Past Surgical History:  Procedure Laterality Date  . ANKLE SURGERY Right   . PERIPHERAL VASCULAR CATHETERIZATION N/A 11/26/2015   Procedure: Glori Luis Cath Insertion;  Surgeon: Katha Cabal, MD;  Location: [place] [facility];  Service: Cardiovascular;  Laterality: N/A;    FAMILY HISTORY :  No family history on file.  SOCIAL HISTORY:   Social History  Substance Use Topics  . Smoking status: Former Smoker    Packs/day: 0.50    Years: 45.00    Types: Cigarettes    Quit date: 11/13/1991  . Smokeless tobacco: Not on file  . Alcohol use 29.4 oz/week    6 Cans of beer, 43 Shots of liquor per week    ALLERGIES:  is allergic to no known allergies.  MEDICATIONS:  Current Outpatient Prescriptions  Medication Sig Dispense Refill  . benzonatate (TESSALON) 100 MG capsule Take 1 capsule (100 mg total) by mouth 3 (three) times daily. 45 capsule 0  . dexamethasone (DECADRON) 4 MG tablet Take one pill AM & PM x 3 days; start the day prior to chemo. 60 tablet 0  . diphenoxylate-atropine (LOMOTIL) 2.5-0.025 MG tablet Take 1 tablet by mouth 4 (four) times daily as needed for diarrhea or loose stools. 30 tablet 0  . fentaNYL (DURAGESIC - DOSED MCG/HR) 50 MCG/HR Place 1 patch (50 mcg total) onto the skin every 3 (three)  days. 10 patch 0  . ferrous gluconate (FERGON) 324 MG tablet Take 1 tablet (324 mg total) by mouth daily with breakfast. 30 tablet 3  . lactose free nutrition (BOOST) LIQD Take 237 mLs by mouth 2 (two) times daily between meals. 60 Can 3  . lidocaine-prilocaine (EMLA) cream Apply cream 1 hour before chemotherapy treatment and apply saran wrap over cream to protect clothing 30 g 1  . loratadine (CLARITIN) 10 MG tablet Take 1 tablet (10 mg total) by mouth daily as needed for allergies. 30 tablet 3  . ondansetron (ZOFRAN) 4 MG tablet Take 1 tablet (4 mg total) by mouth every 6 (six) hours as needed. 30  tablet 3  . Oxycodone HCl 10 MG TABS Take 1 tablet (10 mg total) by mouth every 6 (six) hours as needed. 60 tablet 0  . predniSONE (DELTASONE) 5 MG tablet TAKE 1 TABLET (5 MG TOTAL) BY MOUTH 2 (TWO) TIMES DAILY. 60 tablet 3  . ranitidine (ZANTAC) 150 MG tablet TAKE 1 TABLET (150 MG TOTAL) BY MOUTH AT BEDTIME. 30 tablet 3   No current facility-administered medications for this visit.    Facility-Administered Medications Ordered in Other Visits  Medication Dose Route Frequency Provider Last Rate Last Dose  . sodium chloride flush (NS) 0.9 % injection 10 mL  10 mL Intracatheter PRN Cammie Sickle, MD        PHYSICAL EXAMINATION: ECOG PERFORMANCE STATUS: 2 - Symptomatic, <50% confined to bed  BP 111/78 (BP Location: Left Arm, Patient Position: Sitting)   Pulse (!) 103   Temp 98.3 F (36.8 C) (Tympanic)   Resp 18   Wt 134 lb 5 oz (60.9 kg)   BMI 19.27 kg/m   Filed Weights   06/11/16 0945  Weight: 134 lb 5 oz (60.9 kg)    GENERAL: Moderately nourished moderately built; Alert, no distress and comfortable. He is in a wheelchair. Accompanied by his ex-wife. EYES: Positive for pallor; no icterus OROPHARYNX: no thrush or ulceration; good dentition  NECK: supple, no masses felt LYMPH:  no palpable lymphadenopathy in the cervical, axillary or inguinal regions LUNGS: clear to auscultation and  No wheeze or crackles HEART/CVS: regular rate & rhythm and no murmurs; No lower extremity edema ABDOMEN:abdomen soft, non-tender and normal bowel sounds; positive for hepatomegaly Musculoskeletal:no cyanosis of digits and no clubbing  PSYCH: alert & oriented x 3 with fluent speech NEURO: no focal motor/sensory deficits SKIN:  no rashes or significant lesions  LABORATORY DATA:  I have reviewed the data as listed    Component Value Date/Time   NA 131 (L) 06/11/2016 0920   NA 129 (L) 02/22/2015 0932   K 4.9 06/11/2016 0920   K 4.4 02/22/2015 0932   CL 100 (L) 06/11/2016 0920   CL 96 (L)  02/22/2015 0932   CO2 24 06/11/2016 0920   CO2 26 02/22/2015 0932   GLUCOSE 137 (H) 06/11/2016 0920   GLUCOSE 111 (H) 02/22/2015 0932   BUN 25 (H) 06/11/2016 0920   BUN 25 (H) 02/22/2015 0932   CREATININE 1.80 (H) 06/11/2016 0920   CREATININE 0.96 02/22/2015 0932   CALCIUM 8.2 (L) 06/11/2016 0920   CALCIUM 7.7 (L) 02/22/2015 0932   PROT 7.6 05/14/2016 0917   PROT 8.0 02/22/2015 0932   ALBUMIN 3.2 (L) 05/14/2016 0917   ALBUMIN 3.3 (L) 02/22/2015 0932   AST 29 05/14/2016 0917   AST 65 (H) 02/22/2015 0932   ALT 21 05/14/2016 0917   ALT 63 02/22/2015 0932  ALKPHOS 229 (H) 05/14/2016 0917   ALKPHOS 256 (H) 02/22/2015 0932   BILITOT 0.4 05/14/2016 0917   BILITOT 1.0 02/22/2015 0932   GFRNONAA 35 (L) 06/11/2016 0920   GFRNONAA >60 02/22/2015 0932   GFRAA 41 (L) 06/11/2016 0920   GFRAA >60 02/22/2015 0932    No results found for: SPEP, UPEP  Lab Results  Component Value Date   WBC 14.9 (H) 06/11/2016   NEUTROABS 11.4 (H) 06/11/2016   HGB 8.3 (L) 06/11/2016   HCT 25.9 (L) 06/11/2016   MCV 79.3 (L) 06/11/2016   PLT 363 06/11/2016      Chemistry      Component Value Date/Time   NA 131 (L) 06/11/2016 0920   NA 129 (L) 02/22/2015 0932   K 4.9 06/11/2016 0920   K 4.4 02/22/2015 0932   CL 100 (L) 06/11/2016 0920   CL 96 (L) 02/22/2015 0932   CO2 24 06/11/2016 0920   CO2 26 02/22/2015 0932   BUN 25 (H) 06/11/2016 0920   BUN 25 (H) 02/22/2015 0932   CREATININE 1.80 (H) 06/11/2016 0920   CREATININE 0.96 02/22/2015 0932      Component Value Date/Time   CALCIUM 8.2 (L) 06/11/2016 0920   CALCIUM 7.7 (L) 02/22/2015 0932   ALKPHOS 229 (H) 05/14/2016 0917   ALKPHOS 256 (H) 02/22/2015 0932   AST 29 05/14/2016 0917   AST 65 (H) 02/22/2015 0932   ALT 21 05/14/2016 0917   ALT 63 02/22/2015 0932   BILITOT 0.4 05/14/2016 0917   BILITOT 1.0 02/22/2015 0932       RADIOGRAPHIC STUDIES: I have personally reviewed the radiological images as listed and agreed with the findings  in the report. No results found.   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 PSA - increasing to 477/ CT- liver progression on Docetaxl. Tolerated cycle #1 of cabazitaxel without any major side effects.  # s/p cabazitaxel q 3 w- 15 mg/m2.  Proceed with cycle # 2 today.cont Firmagon q monthly. Labs were okay except for anemia hemoglobin 8.5 and creatinine 1.8.  # pain from neoplasm- stable. New scripts given.  # anemia-  hold tube/ possible; transfusion.   # tachycardia- dehydration- plans IVFs today. Creat-1.8/ discussed hospice if clinically worse or poor tol to chemo.   # weekly labs/ IVFs.   Follow up in 3 weeks/ labs/chemo/firmagon.   Orders Placed This Encounter  Procedures  . CBC with Differential    Standing Status:   Future    Standing Expiration Date:   06/11/2017  . Basic metabolic panel    Standing Status:   Future    Standing Expiration Date:   06/11/2017  . CBC with Differential    Standing Status:   Future    Standing Expiration Date:   06/11/2017  . Basic metabolic panel    Standing Status:   Future    Standing Expiration Date:   06/11/2017  . CBC with Differential    Standing Status:   Future    Standing Expiration Date:   06/11/2017  . Comprehensive metabolic panel    Standing Status:   Future    Standing Expiration Date:   06/11/2017       Cammie Sickle, MD 06/11/2016 8:47 PM

## 2016-06-16 ENCOUNTER — Other Ambulatory Visit: Payer: Self-pay | Admitting: Oncology

## 2016-06-16 DIAGNOSIS — C61 Malignant neoplasm of prostate: Secondary | ICD-10-CM

## 2016-06-18 ENCOUNTER — Inpatient Hospital Stay: Payer: Medicare Other

## 2016-06-18 VITALS — BP 111/72 | HR 101 | Temp 96.5°F | Resp 18

## 2016-06-18 DIAGNOSIS — Z5111 Encounter for antineoplastic chemotherapy: Secondary | ICD-10-CM | POA: Diagnosis not present

## 2016-06-18 DIAGNOSIS — R634 Abnormal weight loss: Secondary | ICD-10-CM

## 2016-06-18 DIAGNOSIS — G893 Neoplasm related pain (acute) (chronic): Secondary | ICD-10-CM

## 2016-06-18 DIAGNOSIS — C61 Malignant neoplasm of prostate: Secondary | ICD-10-CM

## 2016-06-18 LAB — BASIC METABOLIC PANEL
ANION GAP: 10 (ref 5–15)
BUN: 22 mg/dL — ABNORMAL HIGH (ref 6–20)
CHLORIDE: 103 mmol/L (ref 101–111)
CO2: 23 mmol/L (ref 22–32)
Calcium: 8.2 mg/dL — ABNORMAL LOW (ref 8.9–10.3)
Creatinine, Ser: 1.24 mg/dL (ref 0.61–1.24)
GFR calc non Af Amer: 55 mL/min — ABNORMAL LOW (ref >60)
Glucose, Bld: 109 mg/dL — ABNORMAL HIGH (ref 65–99)
POTASSIUM: 4.4 mmol/L (ref 3.5–5.1)
SODIUM: 136 mmol/L (ref 135–145)

## 2016-06-18 LAB — CBC WITH DIFFERENTIAL/PLATELET
BASOS ABS: 0 10*3/uL (ref 0–0.1)
Basophils Relative: 0 %
EOS ABS: 0 10*3/uL (ref 0–0.7)
Eosinophils Relative: 0 %
HCT: 27 % — ABNORMAL LOW (ref 40.0–52.0)
HEMOGLOBIN: 8.4 g/dL — AB (ref 13.0–18.0)
LYMPHS PCT: 7 %
Lymphs Abs: 4.2 10*3/uL — ABNORMAL HIGH (ref 1.0–3.6)
MCH: 24.8 pg — ABNORMAL LOW (ref 26.0–34.0)
MCHC: 31.1 g/dL — ABNORMAL LOW (ref 32.0–36.0)
MCV: 79.7 fL — ABNORMAL LOW (ref 80.0–100.0)
Monocytes Absolute: 0.6 10*3/uL (ref 0.2–1.0)
Monocytes Relative: 1 %
NEUTROS PCT: 92 %
Neutro Abs: 55.7 10*3/uL — ABNORMAL HIGH (ref 1.4–6.5)
Platelets: 345 10*3/uL (ref 150–440)
RBC: 3.39 MIL/uL — ABNORMAL LOW (ref 4.40–5.90)
RDW: 24.8 % — ABNORMAL HIGH (ref 11.5–14.5)
WBC: 60.5 10*3/uL (ref 3.8–10.6)

## 2016-06-18 MED ORDER — SODIUM CHLORIDE 0.9 % IV SOLN
Freq: Once | INTRAVENOUS | Status: AC
  Administered 2016-06-18: 10:00:00 via INTRAVENOUS
  Filled 2016-06-18: qty 1000

## 2016-06-18 MED ORDER — DEGARELIX ACETATE 80 MG ~~LOC~~ SOLR
80.0000 mg | Freq: Once | SUBCUTANEOUS | Status: AC
  Administered 2016-06-18: 80 mg via SUBCUTANEOUS
  Filled 2016-06-18: qty 4

## 2016-06-18 MED ORDER — HEPARIN SOD (PORK) LOCK FLUSH 100 UNIT/ML IV SOLN
500.0000 [IU] | Freq: Once | INTRAVENOUS | Status: AC
  Administered 2016-06-18: 500 [IU] via INTRAVENOUS

## 2016-06-18 MED ORDER — SODIUM CHLORIDE 0.9 % IV SOLN
Freq: Once | INTRAVENOUS | Status: AC
  Administered 2016-06-18: 10:00:00 via INTRAVENOUS
  Filled 2016-06-18: qty 4

## 2016-06-18 MED ORDER — HEPARIN SOD (PORK) LOCK FLUSH 100 UNIT/ML IV SOLN
INTRAVENOUS | Status: AC
  Filled 2016-06-18: qty 5

## 2016-06-23 ENCOUNTER — Other Ambulatory Visit: Payer: Self-pay | Admitting: *Deleted

## 2016-06-23 DIAGNOSIS — R634 Abnormal weight loss: Secondary | ICD-10-CM

## 2016-06-23 DIAGNOSIS — G893 Neoplasm related pain (acute) (chronic): Secondary | ICD-10-CM

## 2016-06-23 DIAGNOSIS — C61 Malignant neoplasm of prostate: Secondary | ICD-10-CM

## 2016-06-23 MED ORDER — RANITIDINE HCL 150 MG PO TABS: ORAL_TABLET | ORAL | 1 refills | Status: AC

## 2016-06-25 ENCOUNTER — Inpatient Hospital Stay: Payer: Medicare Other

## 2016-06-25 ENCOUNTER — Other Ambulatory Visit: Payer: Self-pay | Admitting: Internal Medicine

## 2016-06-25 ENCOUNTER — Telehealth: Payer: Self-pay | Admitting: *Deleted

## 2016-06-25 VITALS — BP 104/75 | HR 112 | Temp 97.4°F | Resp 16

## 2016-06-25 DIAGNOSIS — R634 Abnormal weight loss: Secondary | ICD-10-CM

## 2016-06-25 DIAGNOSIS — R1115 Cyclical vomiting syndrome unrelated to migraine: Secondary | ICD-10-CM

## 2016-06-25 DIAGNOSIS — Z5111 Encounter for antineoplastic chemotherapy: Secondary | ICD-10-CM | POA: Diagnosis not present

## 2016-06-25 DIAGNOSIS — C61 Malignant neoplasm of prostate: Secondary | ICD-10-CM

## 2016-06-25 DIAGNOSIS — G893 Neoplasm related pain (acute) (chronic): Secondary | ICD-10-CM

## 2016-06-25 DIAGNOSIS — R112 Nausea with vomiting, unspecified: Secondary | ICD-10-CM | POA: Insufficient documentation

## 2016-06-25 LAB — CBC WITH DIFFERENTIAL/PLATELET
BASOS ABS: 0 10*3/uL (ref 0–0.1)
BASOS PCT: 0 %
EOS PCT: 0 %
Eosinophils Absolute: 0 10*3/uL (ref 0–0.7)
HCT: 28.4 % — ABNORMAL LOW (ref 40.0–52.0)
Hemoglobin: 8.8 g/dL — ABNORMAL LOW (ref 13.0–18.0)
LYMPHS PCT: 6 %
Lymphs Abs: 2.2 10*3/uL (ref 1.0–3.6)
MCH: 25 pg — ABNORMAL LOW (ref 26.0–34.0)
MCHC: 30.9 g/dL — ABNORMAL LOW (ref 32.0–36.0)
MCV: 80.9 fL (ref 80.0–100.0)
MONO ABS: 1 10*3/uL (ref 0.2–1.0)
Monocytes Relative: 3 %
Neutro Abs: 33 10*3/uL — ABNORMAL HIGH (ref 1.4–6.5)
Neutrophils Relative %: 91 %
PLATELETS: 322 10*3/uL (ref 150–440)
RBC: 3.51 MIL/uL — ABNORMAL LOW (ref 4.40–5.90)
RDW: 27 % — AB (ref 11.5–14.5)
WBC: 36.2 10*3/uL — ABNORMAL HIGH (ref 3.8–10.6)

## 2016-06-25 LAB — COMPREHENSIVE METABOLIC PANEL
ALT: 58 U/L (ref 17–63)
ANION GAP: 12 (ref 5–15)
AST: 85 U/L — ABNORMAL HIGH (ref 15–41)
Albumin: 3 g/dL — ABNORMAL LOW (ref 3.5–5.0)
Alkaline Phosphatase: 935 U/L — ABNORMAL HIGH (ref 38–126)
BUN: 39 mg/dL — ABNORMAL HIGH (ref 6–20)
CHLORIDE: 99 mmol/L — AB (ref 101–111)
CO2: 24 mmol/L (ref 22–32)
Calcium: 8 mg/dL — ABNORMAL LOW (ref 8.9–10.3)
Creatinine, Ser: 2.17 mg/dL — ABNORMAL HIGH (ref 0.61–1.24)
GFR, EST AFRICAN AMERICAN: 32 mL/min — AB (ref >60)
GFR, EST NON AFRICAN AMERICAN: 28 mL/min — AB (ref >60)
Glucose, Bld: 169 mg/dL — ABNORMAL HIGH (ref 65–99)
POTASSIUM: 4.4 mmol/L (ref 3.5–5.1)
SODIUM: 135 mmol/L (ref 135–145)
Total Bilirubin: 1.3 mg/dL — ABNORMAL HIGH (ref 0.3–1.2)
Total Protein: 6.7 g/dL (ref 6.5–8.1)

## 2016-06-25 MED ORDER — SODIUM CHLORIDE 0.9 % IV SOLN
Freq: Once | INTRAVENOUS | Status: AC
  Administered 2016-06-25: 11:00:00 via INTRAVENOUS
  Filled 2016-06-25: qty 4

## 2016-06-25 MED ORDER — DENOSUMAB 120 MG/1.7ML ~~LOC~~ SOLN: 120.0000 mg | Freq: Once | SUBCUTANEOUS | Status: DC

## 2016-06-25 MED ORDER — HEPARIN SOD (PORK) LOCK FLUSH 100 UNIT/ML IV SOLN
500.0000 [IU] | Freq: Once | INTRAVENOUS | Status: AC | PRN
  Administered 2016-06-25: 500 [IU]
  Filled 2016-06-25: qty 5

## 2016-06-25 MED ORDER — PROMETHAZINE HCL 25 MG/ML IJ SOLN
12.5000 mg | Freq: Once | INTRAMUSCULAR | Status: AC
  Administered 2016-06-25: 12.5 mg via INTRAVENOUS
  Filled 2016-06-25: qty 1

## 2016-06-25 MED ORDER — SODIUM CHLORIDE 0.9 % IJ SOLN
10.0000 mL | INTRAMUSCULAR | Status: DC | PRN
  Administered 2016-06-25: 10 mL
  Filled 2016-06-25: qty 10

## 2016-06-25 MED ORDER — SODIUM CHLORIDE 0.9 % IV SOLN
Freq: Once | INTRAVENOUS | Status: AC
  Administered 2016-06-25: 10:00:00 via INTRAVENOUS
  Filled 2016-06-25: qty 1000

## 2016-06-25 MED ORDER — PROMETHAZINE HCL 25 MG/ML IJ SOLN: 25.0000 mg | Freq: Once | INTRAMUSCULAR | Status: DC

## 2016-06-25 NOTE — Telephone Encounter (Signed)
-----   Message from [place] sent at 06/25/2016  9:13 AM EDT ----- Please call pt sick n/v since 6:30 suppose to come get fluids do labs

## 2016-06-25 NOTE — Progress Notes (Signed)
Pt starting feeling nauseated, small amt of emesis noted. Called Dr Rogue Bussing and phenergan ordered

## 2016-06-25 NOTE — Telephone Encounter (Addendum)
I returned call and instructed that patient needs to come in as scheduled this morning for IVF and get some med for nausea, we got disconnected. She called back and said he is on his way now. I notified infusion charge nurse and registration of this

## 2016-07-01 ENCOUNTER — Other Ambulatory Visit: Payer: Self-pay

## 2016-07-01 DIAGNOSIS — C61 Malignant neoplasm of prostate: Secondary | ICD-10-CM

## 2016-07-02 ENCOUNTER — Inpatient Hospital Stay (HOSPITAL_BASED_OUTPATIENT_CLINIC_OR_DEPARTMENT_OTHER): Payer: Medicare Other | Admitting: Internal Medicine

## 2016-07-02 ENCOUNTER — Encounter: Payer: Self-pay | Admitting: Internal Medicine

## 2016-07-02 ENCOUNTER — Inpatient Hospital Stay: Payer: Medicare Other

## 2016-07-02 ENCOUNTER — Ambulatory Visit: Payer: Medicare Other

## 2016-07-02 VITALS — BP 113/75 | HR 102 | Temp 95.4°F | Resp 16 | Ht 70.0 in | Wt 119.2 lb

## 2016-07-02 DIAGNOSIS — D649 Anemia, unspecified: Secondary | ICD-10-CM

## 2016-07-02 DIAGNOSIS — C61 Malignant neoplasm of prostate: Secondary | ICD-10-CM | POA: Diagnosis not present

## 2016-07-02 DIAGNOSIS — C787 Secondary malignant neoplasm of liver and intrahepatic bile duct: Secondary | ICD-10-CM | POA: Diagnosis not present

## 2016-07-02 DIAGNOSIS — G893 Neoplasm related pain (acute) (chronic): Secondary | ICD-10-CM

## 2016-07-02 DIAGNOSIS — R944 Abnormal results of kidney function studies: Secondary | ICD-10-CM | POA: Diagnosis not present

## 2016-07-02 DIAGNOSIS — Z79899 Other long term (current) drug therapy: Secondary | ICD-10-CM

## 2016-07-02 DIAGNOSIS — Z5111 Encounter for antineoplastic chemotherapy: Secondary | ICD-10-CM | POA: Diagnosis not present

## 2016-07-02 LAB — COMPREHENSIVE METABOLIC PANEL
ALK PHOS: 892 U/L — AB (ref 38–126)
ALT: 52 U/L (ref 17–63)
ANION GAP: 7 (ref 5–15)
AST: 99 U/L — ABNORMAL HIGH (ref 15–41)
Albumin: 2.7 g/dL — ABNORMAL LOW (ref 3.5–5.0)
BUN: 34 mg/dL — ABNORMAL HIGH (ref 6–20)
CALCIUM: 8 mg/dL — AB (ref 8.9–10.3)
CO2: 24 mmol/L (ref 22–32)
CREATININE: 2.08 mg/dL — AB (ref 0.61–1.24)
Chloride: 105 mmol/L (ref 101–111)
GFR, EST AFRICAN AMERICAN: 34 mL/min — AB (ref >60)
GFR, EST NON AFRICAN AMERICAN: 29 mL/min — AB (ref >60)
Glucose, Bld: 135 mg/dL — ABNORMAL HIGH (ref 65–99)
Potassium: 4.6 mmol/L (ref 3.5–5.1)
SODIUM: 136 mmol/L (ref 135–145)
Total Bilirubin: 1.6 mg/dL — ABNORMAL HIGH (ref 0.3–1.2)
Total Protein: 6.3 g/dL — ABNORMAL LOW (ref 6.5–8.1)

## 2016-07-02 LAB — CBC WITH DIFFERENTIAL/PLATELET
BASOS ABS: 0 10*3/uL (ref 0–0.1)
BASOS PCT: 0 %
Eosinophils Absolute: 0 10*3/uL (ref 0–0.7)
Eosinophils Relative: 0 %
HCT: 28.3 % — ABNORMAL LOW (ref 40.0–52.0)
Hemoglobin: 9.1 g/dL — ABNORMAL LOW (ref 13.0–18.0)
Lymphocytes Relative: 10 %
Lymphs Abs: 1.7 10*3/uL (ref 1.0–3.6)
MCH: 26.2 pg (ref 26.0–34.0)
MCHC: 32.1 g/dL (ref 32.0–36.0)
MCV: 81.7 fL (ref 80.0–100.0)
MONO ABS: 1 10*3/uL (ref 0.2–1.0)
Monocytes Relative: 6 %
NEUTROS ABS: 14.9 10*3/uL — AB (ref 1.4–6.5)
NEUTROS PCT: 84 %
PLATELETS: 379 10*3/uL (ref 150–440)
RBC: 3.46 MIL/uL — AB (ref 4.40–5.90)
RDW: 28.3 % — ABNORMAL HIGH (ref 11.5–14.5)
WBC: 17.6 10*3/uL — ABNORMAL HIGH (ref 3.8–10.6)

## 2016-07-02 MED ORDER — SODIUM CHLORIDE 0.9 % IV SOLN
Freq: Once | INTRAVENOUS | Status: AC
  Administered 2016-07-02: 10:00:00 via INTRAVENOUS
  Filled 2016-07-02: qty 1000

## 2016-07-02 MED ORDER — SODIUM CHLORIDE 0.9 % IV SOLN
Freq: Once | INTRAVENOUS | Status: AC
  Administered 2016-07-02: 10:00:00 via INTRAVENOUS
  Filled 2016-07-02: qty 4

## 2016-07-02 MED ORDER — SODIUM CHLORIDE 0.9 % IJ SOLN
10.0000 mL | Freq: Once | INTRAMUSCULAR | Status: AC
  Administered 2016-07-02: 10 mL via INTRAVENOUS
  Filled 2016-07-02: qty 10

## 2016-07-02 MED ORDER — SODIUM CHLORIDE 0.9 % IV SOLN
INTRAVENOUS | Status: DC
  Administered 2016-07-02: 10:00:00 via INTRAVENOUS
  Filled 2016-07-02: qty 1000

## 2016-07-02 MED ORDER — HEPARIN SOD (PORK) LOCK FLUSH 100 UNIT/ML IV SOLN
500.0000 [IU] | Freq: Once | INTRAVENOUS | Status: AC
  Administered 2016-07-02: 500 [IU] via INTRAVENOUS
  Filled 2016-07-02: qty 5

## 2016-07-02 NOTE — Progress Notes (Signed)
Weight Loss.  Decreased appetite with Nausea and vomiting.  Memory issues.

## 2016-07-02 NOTE — Progress Notes (Signed)
[place] OFFICE PROGRESS NOTE  Patient Care Team: Pcp Not In System as PCP - General  Prostate cancer metastatic to multiple sites [facility])   Staging form: Prostate, AJCC 7th Edition     Clinical: Stage IV (T3, N0, M1b, PSA: 20 or greater, Gleason 8-10 - Poorly differentiated/undifferentiated (marked anaplasia)) - Signed by Forest Gleason, MD on 03/17/2015    Oncology History   1.MRI scan on August 4 of 2015  revealed abnormal liver lesion and multiple areas  of   abnormallity  in the bone suggestive off metastases 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 No appetite. Weight loss. Positive for nausea and vomiting. No diarrhea. Complains of extreme fatigue. He needs IV fluids.  He denies any significant tingling and numbness of his extremities. However he does complain of gait imbalance for which he uses a cane. Denies any swelling in the legs. He is in a wheelchair.  REVIEW OF SYSTEMS:  A complete 10 point review of system is done which is negative except mentioned above/history of present illness.   PAST MEDICAL HISTORY :  Past Medical History:  Diagnosis Date  . Bone cancer ([place])   . Hyperlipidemia   . Prostate cancer ([place])    2014    PAST SURGICAL HISTORY :   Past Surgical  History:  Procedure Laterality Date  . ANKLE SURGERY Right   . PERIPHERAL VASCULAR CATHETERIZATION N/A 11/26/2015   Procedure: Glori Luis Cath Insertion;  Surgeon: Katha Cabal, MD;  Location: [place] [facility];  Service: Cardiovascular;  Laterality: N/A;    FAMILY HISTORY :  No family history on file.  SOCIAL HISTORY:   Social History  Substance Use Topics  . Smoking status: Former Smoker    Packs/day: 0.50    Years: 45.00    Types: Cigarettes    Quit date: 11/13/1991  . Smokeless tobacco: Never Used  . Alcohol use 29.4 oz/week    6 Cans of beer, 43 Shots of liquor per week    ALLERGIES:  is allergic to no known allergies.  MEDICATIONS:  Current Outpatient Prescriptions  Medication Sig Dispense Refill  . dexamethasone (DECADRON) 4 MG tablet Take one pill AM & PM x 3 days; start the day prior to chemo. 60 tablet 0  . diphenoxylate-atropine (LOMOTIL) 2.5-0.025 MG tablet Take 1 tablet by mouth 4 (four) times daily as needed for diarrhea or loose stools. 30 tablet 0  . fentaNYL (DURAGESIC - DOSED MCG/HR) 50 MCG/HR Place 1 patch (50 mcg total) onto the skin every 3 (three) days. 10 patch 0  . ferrous gluconate (FERGON) 324 MG tablet TAKE 1 TABLET (324 MG TOTAL) BY MOUTH DAILY WITH BREAKFAST. 30 tablet 3  . lactose free nutrition (BOOST) LIQD Take 237 mLs by mouth 2 (two) times  daily between meals. 60 Can 3  . lidocaine-prilocaine (EMLA) cream Apply cream 1 hour before chemotherapy treatment and apply saran wrap over cream to protect clothing 30 g 1  . loratadine (CLARITIN) 10 MG tablet Take 1 tablet (10 mg total) by mouth daily as needed for allergies. 30 tablet 3  . ondansetron (ZOFRAN) 4 MG tablet Take 1 tablet (4 mg total) by mouth every 6 (six) hours as needed. 30 tablet 3  . Oxycodone HCl 10 MG TABS Take 1 tablet (10 mg total) by mouth every 6 (six) hours as needed. 60 tablet 0  . ranitidine (ZANTAC) 150 MG tablet TAKE 1 TABLET (150 MG TOTAL) BY MOUTH AT BEDTIME. 90 tablet 1  .  benzonatate (TESSALON) 100 MG capsule Take 1 capsule (100 mg total) by mouth 3 (three) times daily. (Patient not taking: Reported on 07/02/2016) 45 capsule 0  . predniSONE (DELTASONE) 5 MG tablet TAKE 1 TABLET (5 MG TOTAL) BY MOUTH 2 (TWO) TIMES DAILY. 60 tablet 3   No current facility-administered medications for this visit.    Facility-Administered Medications Ordered in Other Visits  Medication Dose Route Frequency Provider Last Rate Last Dose  . 0.9 %  sodium chloride infusion   Intravenous Continuous Cammie Sickle, MD   Stopped at 07/02/16 1200  . sodium chloride flush (NS) 0.9 % injection 10 mL  10 mL Intracatheter PRN Cammie Sickle, MD        PHYSICAL EXAMINATION: ECOG PERFORMANCE STATUS: 2 - Symptomatic, <50% confined to bed  BP 113/75 (BP Location: Left Arm, Patient Position: Sitting)   Pulse (!) 102   Temp (!) 95.4 F (35.2 C) (Tympanic)   Resp 16   Ht 5\' 10"  (1.778 m)   Wt 119 lb 3.2 oz (54.1 kg)   BMI 17.10 kg/m   Filed Weights   07/02/16 0913  Weight: 119 lb 3.2 oz (54.1 kg)    GENERAL: Moderately nourished moderately built; Alert, no distress and comfortable. He is in a wheelchair. Accompanied by his ex-wife. EYES: Positive for pallor; no icterus OROPHARYNX: no thrush or ulceration; good dentition  NECK: supple, no masses felt LYMPH:  no palpable lymphadenopathy in the cervical, axillary or inguinal regions LUNGS: clear to auscultation and  No wheeze or crackles HEART/CVS: regular rate & rhythm and no murmurs; No lower extremity edema ABDOMEN:abdomen soft, non-tender and normal bowel sounds; positive for hepatomegaly Musculoskeletal:no cyanosis of digits and no clubbing  PSYCH: alert & oriented x 3 with fluent speech NEURO: no focal motor/sensory deficits SKIN:  no rashes or significant lesions  LABORATORY DATA:  I have reviewed the data as listed    Component Value Date/Time   NA 136 07/02/2016 0900   NA 129 (L) 02/22/2015 0932   K 4.6  07/02/2016 0900   K 4.4 02/22/2015 0932   CL 105 07/02/2016 0900   CL 96 (L) 02/22/2015 0932   CO2 24 07/02/2016 0900   CO2 26 02/22/2015 0932   GLUCOSE 135 (H) 07/02/2016 0900   GLUCOSE 111 (H) 02/22/2015 0932   BUN 34 (H) 07/02/2016 0900   BUN 25 (H) 02/22/2015 0932   CREATININE 2.08 (H) 07/02/2016 0900   CREATININE 0.96 02/22/2015 0932   CALCIUM 8.0 (L) 07/02/2016 0900   CALCIUM 7.7 (L) 02/22/2015 0932   PROT 6.3 (L) 07/02/2016 0900   PROT 8.0 02/22/2015 0932   ALBUMIN 2.7 (L) 07/02/2016 0900   ALBUMIN 3.3 (L) 02/22/2015 0932   AST 99 (H) 07/02/2016 0900   AST  65 (H) 02/22/2015 0932   ALT 52 07/02/2016 0900   ALT 63 02/22/2015 0932   ALKPHOS 892 (H) 07/02/2016 0900   ALKPHOS 256 (H) 02/22/2015 0932   BILITOT 1.6 (H) 07/02/2016 0900   BILITOT 1.0 02/22/2015 0932   GFRNONAA 29 (L) 07/02/2016 0900   GFRNONAA >60 02/22/2015 0932   GFRAA 34 (L) 07/02/2016 0900   GFRAA >60 02/22/2015 0932    No results found for: SPEP, UPEP  Lab Results  Component Value Date   WBC 17.6 (H) 07/02/2016   NEUTROABS 14.9 (H) 07/02/2016   HGB 9.1 (L) 07/02/2016   HCT 28.3 (L) 07/02/2016   MCV 81.7 07/02/2016   PLT 379 07/02/2016      Chemistry      Component Value Date/Time   NA 136 07/02/2016 0900   NA 129 (L) 02/22/2015 0932   K 4.6 07/02/2016 0900   K 4.4 02/22/2015 0932   CL 105 07/02/2016 0900   CL 96 (L) 02/22/2015 0932   CO2 24 07/02/2016 0900   CO2 26 02/22/2015 0932   BUN 34 (H) 07/02/2016 0900   BUN 25 (H) 02/22/2015 0932   CREATININE 2.08 (H) 07/02/2016 0900   CREATININE 0.96 02/22/2015 0932      Component Value Date/Time   CALCIUM 8.0 (L) 07/02/2016 0900   CALCIUM 7.7 (L) 02/22/2015 0932   ALKPHOS 892 (H) 07/02/2016 0900   ALKPHOS 256 (H) 02/22/2015 0932   AST 99 (H) 07/02/2016 0900   AST 65 (H) 02/22/2015 0932   ALT 52 07/02/2016 0900   ALT 63 02/22/2015 0932   BILITOT 1.6 (H) 07/02/2016 0900   BILITOT 1.0 02/22/2015 0932       RADIOGRAPHIC STUDIES: I  have personally reviewed the radiological images as listed and agreed with the findings in the report. No results found.   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 PSA - increasing to 477/ CT- liver progression on Docetaxl.  s/p cabazitaxel q 3 w- 15 mg/m2.  S/p cycle # 2 today.cont Firmagon q monthly. Tolerating chemotherapy poorly.  # Dehydration/poor by mouth intake- recommend IV fluids today.  # Severe fatigue/declining performance status/creatinine 2- acute renal failure/dehydration from poor by mouth intake- I do not think patient is a candidate for further chemotherapy. Recommend hospice will initiate a referral. Discussed the hospice philosophy-patient family interested.  # DNR/DNI- discussed with the patient I would not recommend aggressive measures given his incurable cancer/terminal cancer. No decisions made yet  # pain from neoplasm- stable; continue current regimen.   # [id number]- grandson- discussed at length. Patient lives with his grandson at this time. Will initiate hospice referral.  # Will keep follow-up open.   No orders of the defined types were placed in this encounter.      Cammie Sickle, MD 07/02/2016 1:55 PM

## 2016-07-02 NOTE — Assessment & Plan Note (Signed)
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 PSA - increasing to 477/ CT- liver progression on Docetaxl.  s/p cabazitaxel q 3 w- 15 mg/m2.  S/p cycle # 2 today.cont Firmagon q monthly. Tolerating chemotherapy poorly.  # Dehydration/poor by mouth intake- recommend IV fluids today.  # Severe fatigue/declining performance status/creatinine 2- acute renal failure/dehydration from poor by mouth intake- I do not think patient is a candidate for further chemotherapy. Recommend hospice will initiate a referral. Discussed the hospice philosophy-patient family interested.  # DNR/DNI- discussed with the patient I would not recommend aggressive measures given his incurable cancer/terminal cancer. No decisions made yet  # pain from neoplasm- stable; continue current regimen.   # [id number]- grandson- discussed at length. Patient lives with his grandson at this time. Will initiate hospice referral.  # Will keep follow-up open.

## 2016-07-09 ENCOUNTER — Inpatient Hospital Stay: Payer: Medicare Other

## 2016-07-10 DEATH — deceased

## 2016-07-16 ENCOUNTER — Ambulatory Visit: Payer: Medicare Other

## 2016-10-17 ENCOUNTER — Other Ambulatory Visit: Payer: Self-pay | Admitting: Nurse Practitioner

## 2017-01-02 IMAGING — CT CT ABD-PELV W/ CM
1 of 3 series · 13 of 32 positions shown, 18 images · IV contrast (APPLIED)
Comparison: 10/23/2015

CLINICAL DATA: Restaging metastatic prostate cancer.

EXAM:
CT ABDOMEN AND PELVIS WITH CONTRAST
TECHNIQUE: Multidetector CT imaging of the abdomen and pelvis was performed
using the standard protocol following bolus administration of
intravenous contrast.
CONTRAST:  85mL 4MDIJH-GLL IOPAMIDOL (4MDIJH-GLL) INJECTION 61%

[Series 2: axial st · axial · 0.71mm/px · z∈[-1017,-642]mm · 13 of 85 slices shown, 18 images]
[im 5/85  soft-tissue]
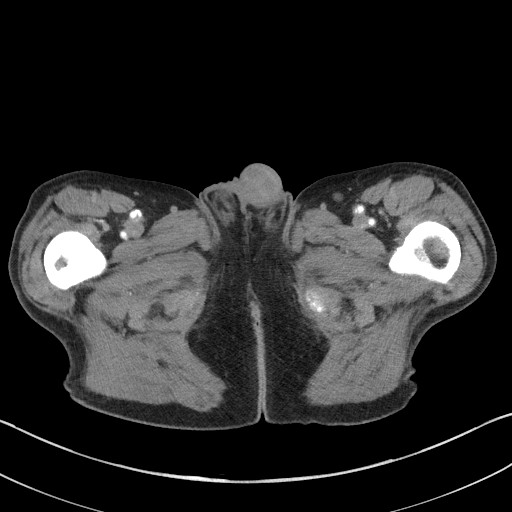
[im 5/85  bone]
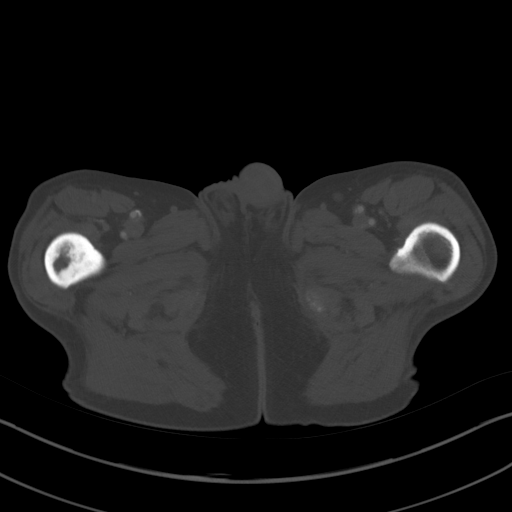
[im 15/85  soft-tissue]
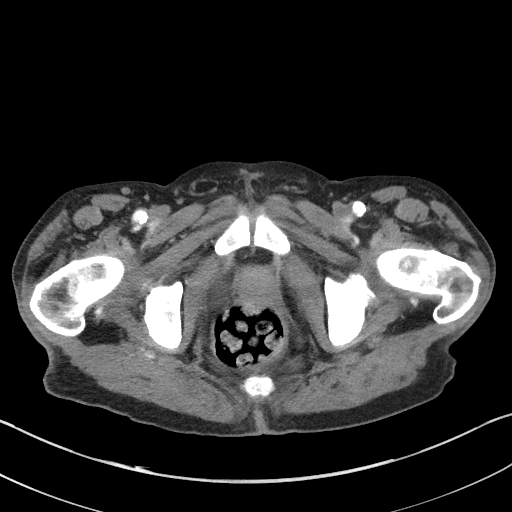
[im 19/85  soft-tissue]
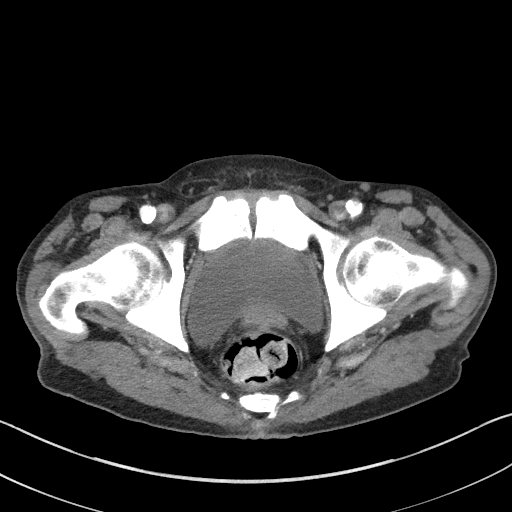
[im 24/85  soft-tissue]
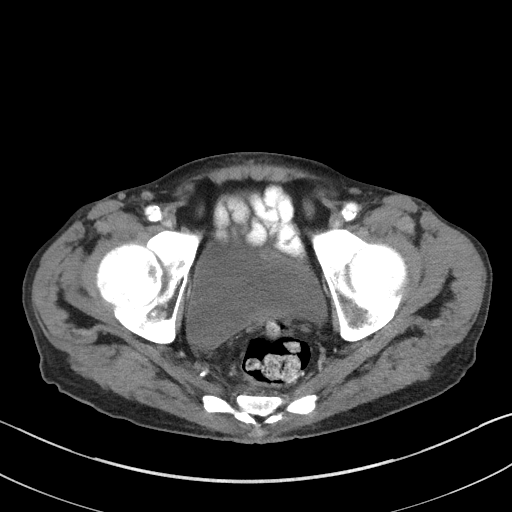
[im 33/85  soft-tissue]
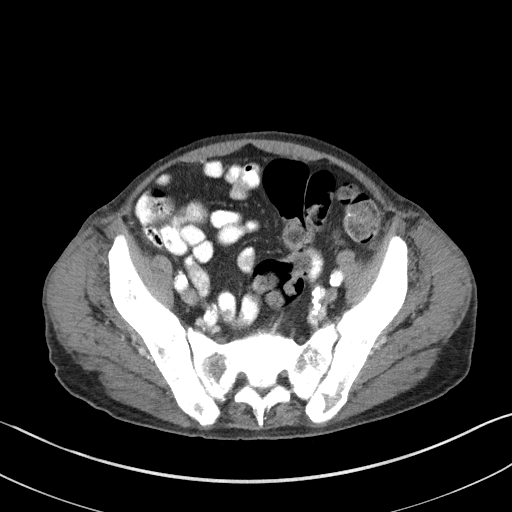
[im 38/85  soft-tissue]
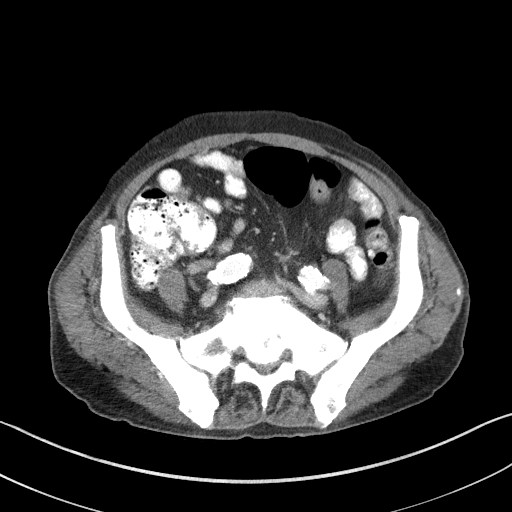
[im 47/85  soft-tissue]
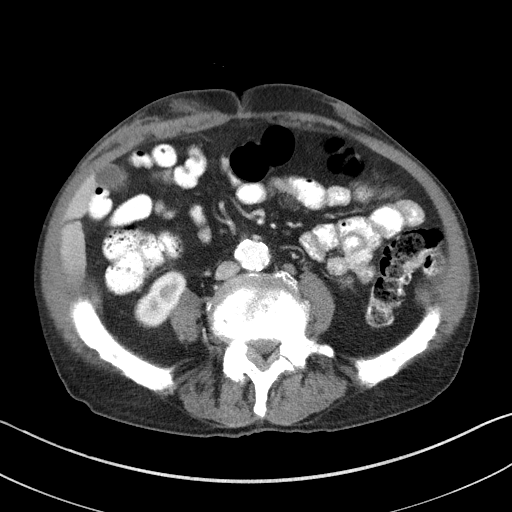
[im 52/85  soft-tissue]
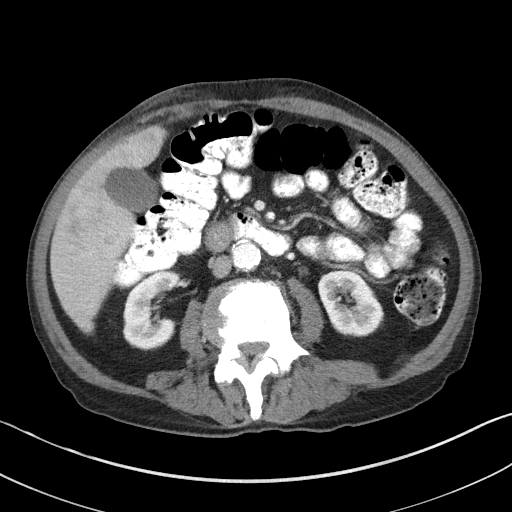
[im 61/85  soft-tissue]
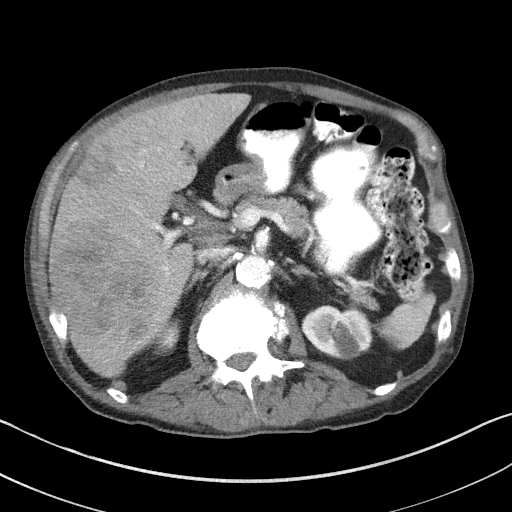
[im 61/85  bone]
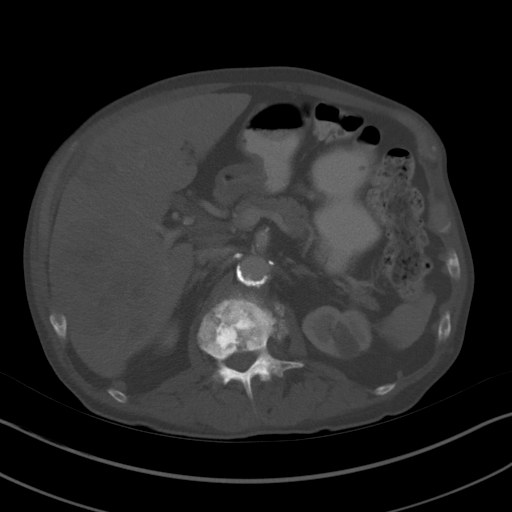
[im 66/85  soft-tissue]
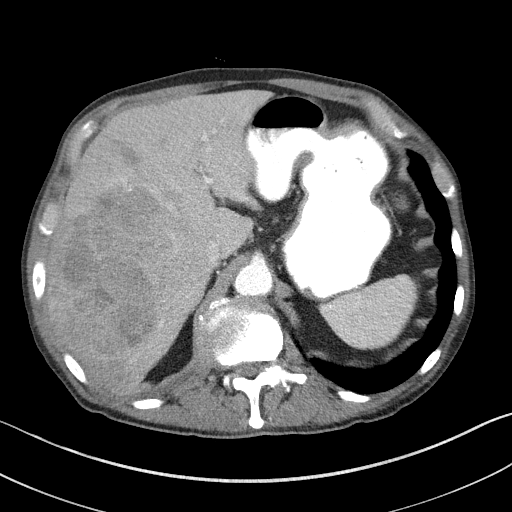
[im 66/85  lung]
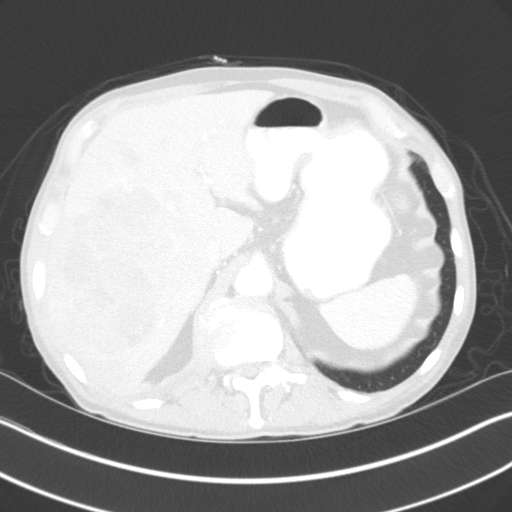
[im 71/85  soft-tissue]
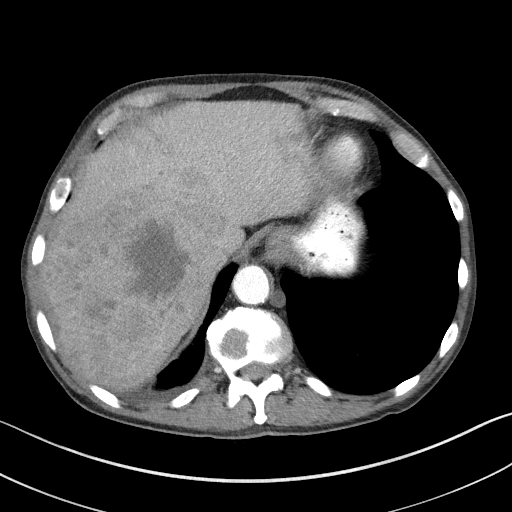
[im 71/85  lung]
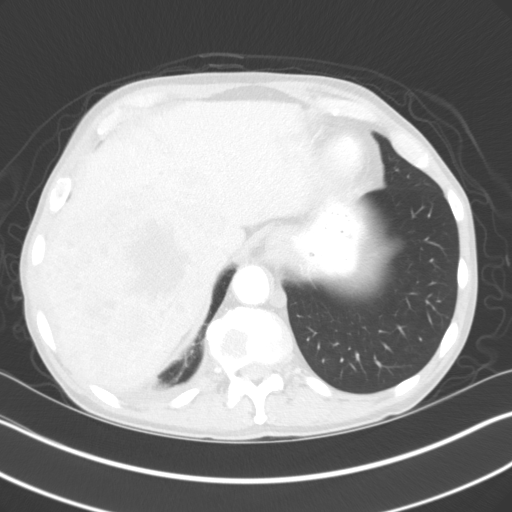
[im 75/85  lung]
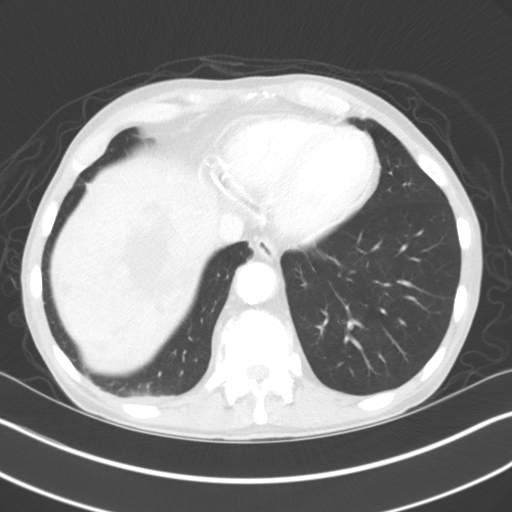
[im 80/85  soft-tissue]
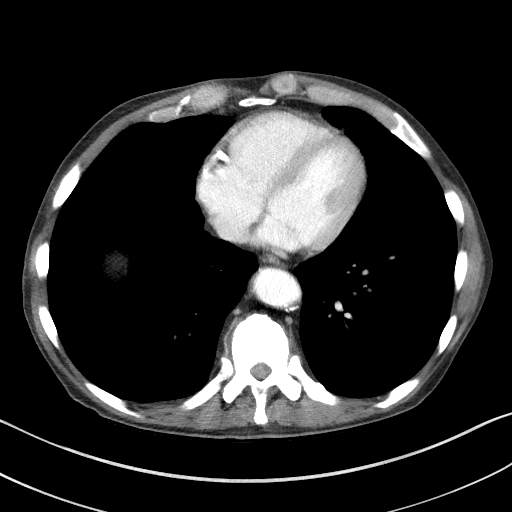
[im 80/85  lung]
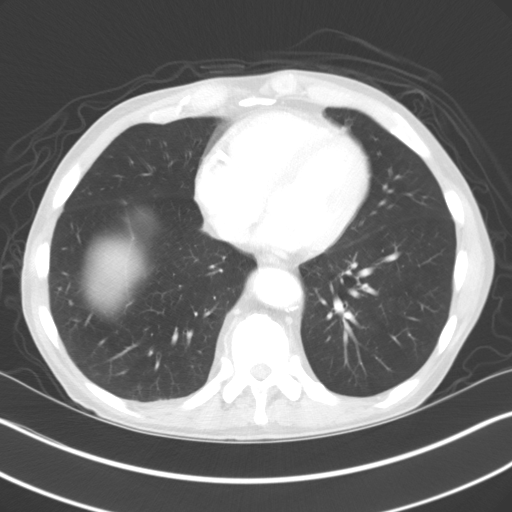

[13 of 32 positions shown; findings below may reference images not displayed]

FINDINGS: Recist

Target lesions:

1. Segment 6 liver lesion measures 10 mm on image number 33, series
2. Previous measurement is 13 mm.
2. Left hepatic lobe lesions adjacent to the gallbladder on image
number 29 series 2 measures 8 mm. Previous 14 mm.

Non target lesions:

1. Left lower lobe pulmonary nodule, not covered on this
examination.
2. Gastrohepatic ligament lymph node measures 9 mm on image 22 of
series 2. Previous measurement 12 mm.
3. Left pelvic sidewall lymph measures 3 mm on image 61 of series 2.
Previous 4 mm.
Lower chest: 4 mm left lower lobe pulmonary nodule on image number 9
previously measured 5 mm. No new pulmonary nodules. The heart is
normal in size. No pericardial effusion. Stable dense 3 vessel
coronary artery calcifications. Stable tortuosity and calcification
of the thoracic aorta.

Hepatobiliary: Large metastatic lesion involving the hepatic dome
has areas of necrosis. The lesion measures approximately 12.5 x
x 9.0 cm. It previously measured 9 x 9 x 7.3 cm. Other smaller
lesions appear relatively stable. Right hepatic lobe lesion on image
number 24 measures 3.5 cm and is stable.

Pancreas: No mass, inflammation or ductal dilatation.

Spleen: Normal size.  No focal lesions.

Adrenals/Urinary Tract: The adrenal glands and kidneys are
unremarkable and stable. Stable bilateral renal cysts.

Stomach/Bowel: The stomach, duodenum, small bowel and colon are
unremarkable. No inflammatory changes, mass lesions or obstructive
findings. The terminal ileum is normal. The appendix is normal.

Vascular/Lymphatic: Advanced atherosclerotic calcifications
involving the aorta and branch vessels but no aneurysm or
dissection. The major venous structures are patent. No enlarged
mesenteric or retroperitoneal lymph nodes. Small scattered lymph
nodes are stable.

Other: No pelvic mass or lymphadenopathy. The bladder is
unremarkable small prostate gland.

Musculoskeletal: Diffuse sclerotic osseous metastatic disease is
again demonstrated. No pathologic fracture or spinal canal
compromise.
IMPRESSION: 1. Enlarging right hepatic lobe mass appears to have coalesced with
the other smaller lesions. No new hepatic lesions.
2. Slight interval decrease size of the left lower lobe pulmonary
nodule.
3. Slight interval decrease in size of gastrohepatic ligament node
and lower left pelvic sidewall lymph node.
4. No new adenopathy.
5. Stable diffuse sclerotic osseous metastatic disease.
